# Patient Record
Sex: Male | Born: 1950
Health system: Southern US, Community
[De-identification: ages and names within clinical notes are randomized; demographics above are authoritative.]

## PROBLEM LIST (undated history)

## (undated) DIAGNOSIS — I10 Essential (primary) hypertension: Secondary | ICD-10-CM

## (undated) DIAGNOSIS — E785 Hyperlipidemia, unspecified: Secondary | ICD-10-CM

## (undated) DIAGNOSIS — E7800 Pure hypercholesterolemia, unspecified: Secondary | ICD-10-CM

## (undated) DIAGNOSIS — E119 Type 2 diabetes mellitus without complications: Secondary | ICD-10-CM

## (undated) DIAGNOSIS — I5020 Unspecified systolic (congestive) heart failure: Secondary | ICD-10-CM

## (undated) DIAGNOSIS — I213 ST elevation (STEMI) myocardial infarction of unspecified site: Secondary | ICD-10-CM

---

## 1998-04-14 ENCOUNTER — Emergency Department (HOSPITAL_COMMUNITY): Admission: EM | Admit: 1998-04-14 | Discharge: 1998-04-14 | Payer: Self-pay | Admitting: Emergency Medicine

## 1998-04-15 ENCOUNTER — Emergency Department (HOSPITAL_COMMUNITY): Admission: EM | Admit: 1998-04-15 | Discharge: 1998-04-15 | Payer: Self-pay | Admitting: Emergency Medicine

## 2018-12-12 DIAGNOSIS — R69 Illness, unspecified: Secondary | ICD-10-CM | POA: Diagnosis not present

## 2018-12-12 DIAGNOSIS — Z13220 Encounter for screening for lipoid disorders: Secondary | ICD-10-CM | POA: Diagnosis not present

## 2018-12-12 DIAGNOSIS — Z1211 Encounter for screening for malignant neoplasm of colon: Secondary | ICD-10-CM | POA: Diagnosis not present

## 2018-12-12 DIAGNOSIS — N529 Male erectile dysfunction, unspecified: Secondary | ICD-10-CM | POA: Diagnosis not present

## 2018-12-12 DIAGNOSIS — Z23 Encounter for immunization: Secondary | ICD-10-CM | POA: Diagnosis not present

## 2018-12-12 DIAGNOSIS — Z1159 Encounter for screening for other viral diseases: Secondary | ICD-10-CM | POA: Diagnosis not present

## 2018-12-12 DIAGNOSIS — Z125 Encounter for screening for malignant neoplasm of prostate: Secondary | ICD-10-CM | POA: Diagnosis not present

## 2018-12-12 DIAGNOSIS — Z0000 Encounter for general adult medical examination without abnormal findings: Secondary | ICD-10-CM | POA: Diagnosis not present

## 2018-12-18 DIAGNOSIS — Z1159 Encounter for screening for other viral diseases: Secondary | ICD-10-CM | POA: Diagnosis not present

## 2018-12-18 DIAGNOSIS — R69 Illness, unspecified: Secondary | ICD-10-CM | POA: Diagnosis not present

## 2018-12-18 DIAGNOSIS — Z125 Encounter for screening for malignant neoplasm of prostate: Secondary | ICD-10-CM | POA: Diagnosis not present

## 2018-12-18 DIAGNOSIS — Z0000 Encounter for general adult medical examination without abnormal findings: Secondary | ICD-10-CM | POA: Diagnosis not present

## 2018-12-18 DIAGNOSIS — Z13220 Encounter for screening for lipoid disorders: Secondary | ICD-10-CM | POA: Diagnosis not present

## 2019-01-01 DIAGNOSIS — R7301 Impaired fasting glucose: Secondary | ICD-10-CM | POA: Diagnosis not present

## 2019-01-09 DIAGNOSIS — E1165 Type 2 diabetes mellitus with hyperglycemia: Secondary | ICD-10-CM | POA: Diagnosis not present

## 2019-04-04 DIAGNOSIS — H52203 Unspecified astigmatism, bilateral: Secondary | ICD-10-CM | POA: Diagnosis not present

## 2019-04-04 DIAGNOSIS — H524 Presbyopia: Secondary | ICD-10-CM | POA: Diagnosis not present

## 2019-04-04 DIAGNOSIS — H5203 Hypermetropia, bilateral: Secondary | ICD-10-CM | POA: Diagnosis not present

## 2019-04-04 DIAGNOSIS — E1165 Type 2 diabetes mellitus with hyperglycemia: Secondary | ICD-10-CM | POA: Diagnosis not present

## 2019-04-04 DIAGNOSIS — H2513 Age-related nuclear cataract, bilateral: Secondary | ICD-10-CM | POA: Diagnosis not present

## 2019-04-25 DIAGNOSIS — E7800 Pure hypercholesterolemia, unspecified: Secondary | ICD-10-CM | POA: Diagnosis not present

## 2019-04-25 DIAGNOSIS — E1165 Type 2 diabetes mellitus with hyperglycemia: Secondary | ICD-10-CM | POA: Diagnosis not present

## 2019-08-11 DIAGNOSIS — Z20828 Contact with and (suspected) exposure to other viral communicable diseases: Secondary | ICD-10-CM | POA: Diagnosis not present

## 2019-08-21 DIAGNOSIS — Z125 Encounter for screening for malignant neoplasm of prostate: Secondary | ICD-10-CM | POA: Diagnosis not present

## 2019-08-21 DIAGNOSIS — E1165 Type 2 diabetes mellitus with hyperglycemia: Secondary | ICD-10-CM | POA: Diagnosis not present

## 2019-08-21 DIAGNOSIS — Z23 Encounter for immunization: Secondary | ICD-10-CM | POA: Diagnosis not present

## 2019-10-22 DIAGNOSIS — M19071 Primary osteoarthritis, right ankle and foot: Secondary | ICD-10-CM | POA: Diagnosis not present

## 2019-10-22 DIAGNOSIS — G5792 Unspecified mononeuropathy of left lower limb: Secondary | ICD-10-CM | POA: Diagnosis not present

## 2019-10-22 DIAGNOSIS — M19072 Primary osteoarthritis, left ankle and foot: Secondary | ICD-10-CM | POA: Diagnosis not present

## 2019-10-22 DIAGNOSIS — B070 Plantar wart: Secondary | ICD-10-CM | POA: Diagnosis not present

## 2019-11-18 DIAGNOSIS — B070 Plantar wart: Secondary | ICD-10-CM | POA: Diagnosis not present

## 2019-12-26 DIAGNOSIS — E1165 Type 2 diabetes mellitus with hyperglycemia: Secondary | ICD-10-CM | POA: Diagnosis not present

## 2019-12-26 DIAGNOSIS — Z0000 Encounter for general adult medical examination without abnormal findings: Secondary | ICD-10-CM | POA: Diagnosis not present

## 2019-12-26 DIAGNOSIS — Z79899 Other long term (current) drug therapy: Secondary | ICD-10-CM | POA: Diagnosis not present

## 2019-12-26 DIAGNOSIS — M25542 Pain in joints of left hand: Secondary | ICD-10-CM | POA: Diagnosis not present

## 2019-12-26 DIAGNOSIS — Z125 Encounter for screening for malignant neoplasm of prostate: Secondary | ICD-10-CM | POA: Diagnosis not present

## 2019-12-26 DIAGNOSIS — E7800 Pure hypercholesterolemia, unspecified: Secondary | ICD-10-CM | POA: Diagnosis not present

## 2019-12-26 DIAGNOSIS — Z5181 Encounter for therapeutic drug level monitoring: Secondary | ICD-10-CM | POA: Diagnosis not present

## 2019-12-26 DIAGNOSIS — M25541 Pain in joints of right hand: Secondary | ICD-10-CM | POA: Diagnosis not present

## 2019-12-26 DIAGNOSIS — N529 Male erectile dysfunction, unspecified: Secondary | ICD-10-CM | POA: Diagnosis not present

## 2019-12-29 DIAGNOSIS — B070 Plantar wart: Secondary | ICD-10-CM | POA: Diagnosis not present

## 2020-01-05 DIAGNOSIS — Z1212 Encounter for screening for malignant neoplasm of rectum: Secondary | ICD-10-CM | POA: Diagnosis not present

## 2020-01-05 DIAGNOSIS — Z1211 Encounter for screening for malignant neoplasm of colon: Secondary | ICD-10-CM | POA: Diagnosis not present

## 2020-01-07 LAB — COLOGUARD: COLOGUARD: NEGATIVE

## 2020-01-07 LAB — EXTERNAL GENERIC LAB PROCEDURE: COLOGUARD: NEGATIVE

## 2020-03-24 DIAGNOSIS — E7800 Pure hypercholesterolemia, unspecified: Secondary | ICD-10-CM | POA: Diagnosis not present

## 2020-03-24 DIAGNOSIS — E119 Type 2 diabetes mellitus without complications: Secondary | ICD-10-CM | POA: Diagnosis not present

## 2020-03-24 DIAGNOSIS — N529 Male erectile dysfunction, unspecified: Secondary | ICD-10-CM | POA: Diagnosis not present

## 2020-03-24 DIAGNOSIS — E538 Deficiency of other specified B group vitamins: Secondary | ICD-10-CM | POA: Diagnosis not present

## 2020-03-24 DIAGNOSIS — E1165 Type 2 diabetes mellitus with hyperglycemia: Secondary | ICD-10-CM | POA: Diagnosis not present

## 2020-04-05 DIAGNOSIS — H5203 Hypermetropia, bilateral: Secondary | ICD-10-CM | POA: Diagnosis not present

## 2020-04-05 DIAGNOSIS — E119 Type 2 diabetes mellitus without complications: Secondary | ICD-10-CM | POA: Diagnosis not present

## 2020-04-05 DIAGNOSIS — H2513 Age-related nuclear cataract, bilateral: Secondary | ICD-10-CM | POA: Diagnosis not present

## 2020-04-05 DIAGNOSIS — H52202 Unspecified astigmatism, left eye: Secondary | ICD-10-CM | POA: Diagnosis not present

## 2020-04-05 DIAGNOSIS — Z7984 Long term (current) use of oral hypoglycemic drugs: Secondary | ICD-10-CM | POA: Diagnosis not present

## 2020-04-05 DIAGNOSIS — H524 Presbyopia: Secondary | ICD-10-CM | POA: Diagnosis not present

## 2020-07-10 ENCOUNTER — Other Ambulatory Visit: Payer: Self-pay

## 2020-07-10 ENCOUNTER — Inpatient Hospital Stay (HOSPITAL_COMMUNITY)
Admission: EM | Admit: 2020-07-10 | Discharge: 2020-07-12 | DRG: 246 | Disposition: A | Discharge status: Home / Self Care | Payer: Medicare HMO | Attending: Cardiovascular Disease | Admitting: Cardiovascular Disease

## 2020-07-10 ENCOUNTER — Encounter (HOSPITAL_COMMUNITY)
Admission: EM | Disposition: A | Discharge status: Home / Self Care | Payer: Self-pay | Source: Home / Self Care | Attending: Cardiovascular Disease

## 2020-07-10 ENCOUNTER — Encounter (HOSPITAL_COMMUNITY): Payer: Self-pay

## 2020-07-10 DIAGNOSIS — I442 Atrioventricular block, complete: Secondary | ICD-10-CM | POA: Diagnosis present

## 2020-07-10 DIAGNOSIS — R9431 Abnormal electrocardiogram [ECG] [EKG]: Secondary | ICD-10-CM | POA: Diagnosis not present

## 2020-07-10 DIAGNOSIS — I10 Essential (primary) hypertension: Secondary | ICD-10-CM | POA: Diagnosis not present

## 2020-07-10 DIAGNOSIS — Z8249 Family history of ischemic heart disease and other diseases of the circulatory system: Secondary | ICD-10-CM | POA: Diagnosis not present

## 2020-07-10 DIAGNOSIS — I252 Old myocardial infarction: Secondary | ICD-10-CM

## 2020-07-10 DIAGNOSIS — E785 Hyperlipidemia, unspecified: Secondary | ICD-10-CM

## 2020-07-10 DIAGNOSIS — I472 Ventricular tachycardia: Secondary | ICD-10-CM | POA: Diagnosis not present

## 2020-07-10 DIAGNOSIS — E7800 Pure hypercholesterolemia, unspecified: Secondary | ICD-10-CM | POA: Diagnosis present

## 2020-07-10 DIAGNOSIS — I420 Dilated cardiomyopathy: Secondary | ICD-10-CM | POA: Diagnosis not present

## 2020-07-10 DIAGNOSIS — R0789 Other chest pain: Secondary | ICD-10-CM | POA: Diagnosis not present

## 2020-07-10 DIAGNOSIS — Z7722 Contact with and (suspected) exposure to environmental tobacco smoke (acute) (chronic): Secondary | ICD-10-CM | POA: Diagnosis present

## 2020-07-10 DIAGNOSIS — Z20822 Contact with and (suspected) exposure to covid-19: Secondary | ICD-10-CM | POA: Diagnosis not present

## 2020-07-10 DIAGNOSIS — I2510 Atherosclerotic heart disease of native coronary artery without angina pectoris: Secondary | ICD-10-CM | POA: Diagnosis present

## 2020-07-10 DIAGNOSIS — I110 Hypertensive heart disease with heart failure: Secondary | ICD-10-CM | POA: Diagnosis present

## 2020-07-10 DIAGNOSIS — E119 Type 2 diabetes mellitus without complications: Secondary | ICD-10-CM | POA: Diagnosis not present

## 2020-07-10 DIAGNOSIS — I2111 ST elevation (STEMI) myocardial infarction involving right coronary artery: Secondary | ICD-10-CM

## 2020-07-10 DIAGNOSIS — Z23 Encounter for immunization: Secondary | ICD-10-CM | POA: Diagnosis not present

## 2020-07-10 DIAGNOSIS — I959 Hypotension, unspecified: Secondary | ICD-10-CM | POA: Diagnosis present

## 2020-07-10 DIAGNOSIS — R001 Bradycardia, unspecified: Secondary | ICD-10-CM | POA: Diagnosis present

## 2020-07-10 DIAGNOSIS — R079 Chest pain, unspecified: Secondary | ICD-10-CM | POA: Diagnosis not present

## 2020-07-10 DIAGNOSIS — I5021 Acute systolic (congestive) heart failure: Secondary | ICD-10-CM | POA: Diagnosis not present

## 2020-07-10 DIAGNOSIS — Z7984 Long term (current) use of oral hypoglycemic drugs: Secondary | ICD-10-CM | POA: Diagnosis not present

## 2020-07-10 DIAGNOSIS — I2119 ST elevation (STEMI) myocardial infarction involving other coronary artery of inferior wall: Secondary | ICD-10-CM | POA: Diagnosis not present

## 2020-07-10 DIAGNOSIS — Z79899 Other long term (current) drug therapy: Secondary | ICD-10-CM | POA: Diagnosis not present

## 2020-07-10 DIAGNOSIS — I499 Cardiac arrhythmia, unspecified: Secondary | ICD-10-CM | POA: Diagnosis not present

## 2020-07-10 DIAGNOSIS — I213 ST elevation (STEMI) myocardial infarction of unspecified site: Secondary | ICD-10-CM

## 2020-07-10 DIAGNOSIS — E782 Mixed hyperlipidemia: Secondary | ICD-10-CM | POA: Diagnosis not present

## 2020-07-10 DIAGNOSIS — Z955 Presence of coronary angioplasty implant and graft: Secondary | ICD-10-CM

## 2020-07-10 HISTORY — DX: Pure hypercholesterolemia, unspecified: E78.00

## 2020-07-10 HISTORY — PX: CORONARY STENT INTERVENTION: CATH118234

## 2020-07-10 HISTORY — DX: Type 2 diabetes mellitus without complications: E11.9

## 2020-07-10 HISTORY — PX: CORONARY THROMBECTOMY: CATH118304

## 2020-07-10 HISTORY — PX: LEFT HEART CATH AND CORONARY ANGIOGRAPHY: CATH118249

## 2020-07-10 HISTORY — DX: ST elevation (STEMI) myocardial infarction of unspecified site: I21.3

## 2020-07-10 HISTORY — DX: Hyperlipidemia, unspecified: E78.5

## 2020-07-10 HISTORY — DX: Essential (primary) hypertension: I10

## 2020-07-10 HISTORY — DX: Unspecified systolic (congestive) heart failure: I50.20

## 2020-07-10 HISTORY — PX: CORONARY/GRAFT ACUTE MI REVASCULARIZATION: CATH118305

## 2020-07-10 LAB — CBC
HCT: 40.7 % (ref 39.0–52.0)
Hemoglobin: 13.4 g/dL (ref 13.0–17.0)
MCH: 30.5 pg (ref 26.0–34.0)
MCHC: 32.9 g/dL (ref 30.0–36.0)
MCV: 92.7 fL (ref 80.0–100.0)
Platelets: 212 10*3/uL (ref 150–400)
RBC: 4.39 MIL/uL (ref 4.22–5.81)
RDW: 12.5 % (ref 11.5–15.5)
WBC: 12.3 10*3/uL — ABNORMAL HIGH (ref 4.0–10.5)
nRBC: 0 % (ref 0.0–0.2)

## 2020-07-10 LAB — CBC WITH DIFFERENTIAL/PLATELET
Abs Immature Granulocytes: 0.07 10*3/uL (ref 0.00–0.07)
Basophils Absolute: 0 10*3/uL (ref 0.0–0.1)
Basophils Relative: 0 %
Eosinophils Absolute: 0 10*3/uL (ref 0.0–0.5)
Eosinophils Relative: 0 %
HCT: 45.6 % (ref 39.0–52.0)
Hemoglobin: 14.6 g/dL (ref 13.0–17.0)
Immature Granulocytes: 1 %
Lymphocytes Relative: 14 %
Lymphs Abs: 1.4 10*3/uL (ref 0.7–4.0)
MCH: 30.4 pg (ref 26.0–34.0)
MCHC: 32 g/dL (ref 30.0–36.0)
MCV: 95 fL (ref 80.0–100.0)
Monocytes Absolute: 0.7 10*3/uL (ref 0.1–1.0)
Monocytes Relative: 7 %
Neutro Abs: 7.4 10*3/uL (ref 1.7–7.7)
Neutrophils Relative %: 78 %
Platelets: 213 10*3/uL (ref 150–400)
RBC: 4.8 MIL/uL (ref 4.22–5.81)
RDW: 12.3 % (ref 11.5–15.5)
WBC: 9.6 10*3/uL (ref 4.0–10.5)
nRBC: 0 % (ref 0.0–0.2)

## 2020-07-10 LAB — LIPID PANEL
Cholesterol: 158 mg/dL (ref 0–200)
HDL: 67 mg/dL (ref >40)
LDL Cholesterol: 86 mg/dL (ref 0–99)
Total CHOL/HDL Ratio: 2.4 RATIO
Triglycerides: 25 mg/dL (ref <150)
VLDL: 5 mg/dL (ref 0–40)

## 2020-07-10 LAB — COMPREHENSIVE METABOLIC PANEL
ALT: 21 U/L (ref 0–44)
AST: 26 U/L (ref 15–41)
Albumin: 4.1 g/dL (ref 3.5–5.0)
Alkaline Phosphatase: 68 U/L (ref 38–126)
Anion gap: 13 (ref 5–15)
BUN: 12 mg/dL (ref 8–23)
CO2: 22 mmol/L (ref 22–32)
Calcium: 9.1 mg/dL (ref 8.9–10.3)
Chloride: 102 mmol/L (ref 98–111)
Creatinine, Ser: 1.05 mg/dL (ref 0.61–1.24)
GFR calc Af Amer: >60 mL/min (ref >60)
GFR calc non Af Amer: >60 mL/min (ref >60)
Glucose, Bld: 199 mg/dL — ABNORMAL HIGH (ref 70–99)
Potassium: 4 mmol/L (ref 3.5–5.1)
Sodium: 137 mmol/L (ref 135–145)
Total Bilirubin: 0.9 mg/dL (ref 0.3–1.2)
Total Protein: 7.5 g/dL (ref 6.5–8.1)

## 2020-07-10 LAB — HEMOGLOBIN A1C
Hgb A1c MFr Bld: 6.6 % — ABNORMAL HIGH (ref 4.8–5.6)
Mean Plasma Glucose: 142.72 mg/dL

## 2020-07-10 LAB — CREATININE, SERUM
Creatinine, Ser: 0.9 mg/dL (ref 0.61–1.24)
GFR calc Af Amer: >60 mL/min (ref >60)
GFR calc non Af Amer: >60 mL/min (ref >60)

## 2020-07-10 LAB — SARS CORONAVIRUS 2 BY RT PCR (HOSPITAL ORDER, PERFORMED IN ~~LOC~~ HOSPITAL LAB): SARS Coronavirus 2: NEGATIVE

## 2020-07-10 LAB — GLUCOSE, CAPILLARY
Glucose-Capillary: 127 mg/dL — ABNORMAL HIGH (ref 70–99)
Glucose-Capillary: 143 mg/dL — ABNORMAL HIGH (ref 70–99)

## 2020-07-10 LAB — TROPONIN I (HIGH SENSITIVITY)
Troponin I (High Sensitivity): 14 ng/L (ref <18)
Troponin I (High Sensitivity): >27000 ng/L (ref <18)

## 2020-07-10 LAB — PLATELET COUNT: Platelets: 200 10*3/uL (ref 150–400)

## 2020-07-10 LAB — APTT: aPTT: 22 seconds — ABNORMAL LOW (ref 24–36)

## 2020-07-10 LAB — PROTIME-INR
INR: 1.1 (ref 0.8–1.2)
Prothrombin Time: 13.5 seconds (ref 11.4–15.2)

## 2020-07-10 LAB — HIV ANTIBODY (ROUTINE TESTING W REFLEX): HIV Screen 4th Generation wRfx: NONREACTIVE

## 2020-07-10 LAB — MRSA PCR SCREENING: MRSA by PCR: NEGATIVE

## 2020-07-10 SURGERY — CORONARY/GRAFT ACUTE MI REVASCULARIZATION
Anesthesia: LOCAL

## 2020-07-10 MED ORDER — TIROFIBAN HCL IN NACL 5-0.9 MG/100ML-% IV SOLN
INTRAVENOUS | Status: AC
  Filled 2020-07-10: qty 100

## 2020-07-10 MED ORDER — ROSUVASTATIN CALCIUM 20 MG PO TABS
40.0000 mg | ORAL_TABLET | Freq: Every day | ORAL | Status: DC
  Filled 2020-07-10: qty 2

## 2020-07-10 MED ORDER — ACETAMINOPHEN 325 MG PO TABS: 650.0000 mg | ORAL_TABLET | ORAL | Status: DC | PRN

## 2020-07-10 MED ORDER — IOHEXOL 350 MG/ML SOLN
INTRAVENOUS | Status: DC | PRN
  Administered 2020-07-10: 130 mL via INTRA_ARTERIAL

## 2020-07-10 MED ORDER — SODIUM CHLORIDE 0.9% FLUSH
3.0000 mL | Freq: Two times a day (BID) | INTRAVENOUS | Status: DC
  Administered 2020-07-11 – 2020-07-12 (×3): 3 mL via INTRAVENOUS

## 2020-07-10 MED ORDER — LABETALOL HCL 5 MG/ML IV SOLN: 10.0000 mg | INTRAVENOUS | Status: AC | PRN

## 2020-07-10 MED ORDER — LIDOCAINE HCL (PF) 1 % IJ SOLN
INTRAMUSCULAR | Status: AC
  Filled 2020-07-10: qty 30

## 2020-07-10 MED ORDER — CHLORHEXIDINE GLUCONATE CLOTH 2 % EX PADS
6.0000 | MEDICATED_PAD | Freq: Every day | CUTANEOUS | Status: DC
  Administered 2020-07-10 – 2020-07-12 (×3): 6 via TOPICAL

## 2020-07-10 MED ORDER — TIROFIBAN HCL IN NACL 5-0.9 MG/100ML-% IV SOLN
INTRAVENOUS | Status: AC | PRN
  Administered 2020-07-10: 0.15 ug/kg/min via INTRAVENOUS

## 2020-07-10 MED ORDER — SODIUM CHLORIDE 0.9 % IV SOLN
INTRAVENOUS | Status: AC | PRN
  Administered 2020-07-10: 10 mL/h via INTRAVENOUS

## 2020-07-10 MED ORDER — MORPHINE SULFATE (PF) 2 MG/ML IV SOLN: 2.0000 mg | INTRAVENOUS | Status: DC | PRN

## 2020-07-10 MED ORDER — METOPROLOL TARTRATE 25 MG PO TABS: 25.0000 mg | ORAL_TABLET | Freq: Two times a day (BID) | ORAL | Status: DC

## 2020-07-10 MED ORDER — TICAGRELOR 90 MG PO TABS
90.0000 mg | ORAL_TABLET | Freq: Two times a day (BID) | ORAL | Status: DC
  Administered 2020-07-10 – 2020-07-12 (×4): 90 mg via ORAL
  Filled 2020-07-10 (×4): qty 1

## 2020-07-10 MED ORDER — HEPARIN (PORCINE) IN NACL 1000-0.9 UT/500ML-% IV SOLN
INTRAVENOUS | Status: AC
  Filled 2020-07-10: qty 1000

## 2020-07-10 MED ORDER — HEPARIN SODIUM (PORCINE) 1000 UNIT/ML IJ SOLN
INTRAMUSCULAR | Status: AC
  Filled 2020-07-10: qty 1

## 2020-07-10 MED ORDER — SODIUM CHLORIDE 0.9 % IV SOLN: 250.0000 mL | INTRAVENOUS | Status: DC | PRN

## 2020-07-10 MED ORDER — ROSUVASTATIN CALCIUM 20 MG PO TABS
40.0000 mg | ORAL_TABLET | Freq: Every day | ORAL | Status: DC
  Administered 2020-07-10 – 2020-07-12 (×3): 40 mg via ORAL
  Filled 2020-07-10 (×2): qty 2

## 2020-07-10 MED ORDER — HEPARIN SODIUM (PORCINE) 5000 UNIT/ML IJ SOLN
5000.0000 [IU] | Freq: Three times a day (TID) | INTRAMUSCULAR | Status: DC
  Administered 2020-07-10 – 2020-07-12 (×6): 5000 [IU] via SUBCUTANEOUS
  Filled 2020-07-10 (×6): qty 1

## 2020-07-10 MED ORDER — ATORVASTATIN CALCIUM 80 MG PO TABS: 80.0000 mg | ORAL_TABLET | Freq: Every day | ORAL | Status: DC

## 2020-07-10 MED ORDER — PNEUMOCOCCAL VAC POLYVALENT 25 MCG/0.5ML IJ INJ
0.5000 mL | INJECTION | INTRAMUSCULAR | Status: AC
  Administered 2020-07-12: 0.5 mL via INTRAMUSCULAR
  Filled 2020-07-10: qty 0.5

## 2020-07-10 MED ORDER — IOHEXOL 350 MG/ML SOLN
INTRAVENOUS | Status: AC
  Filled 2020-07-10: qty 1

## 2020-07-10 MED ORDER — METOPROLOL TARTRATE 12.5 MG HALF TABLET: 12.5000 mg | ORAL_TABLET | Freq: Two times a day (BID) | ORAL | Status: DC

## 2020-07-10 MED ORDER — SODIUM CHLORIDE 0.9% FLUSH: 3.0000 mL | INTRAVENOUS | Status: DC | PRN

## 2020-07-10 MED ORDER — TICAGRELOR 90 MG PO TABS
ORAL_TABLET | ORAL | Status: DC | PRN
  Administered 2020-07-10: 180 mg via ORAL

## 2020-07-10 MED ORDER — ASPIRIN EC 81 MG PO TBEC: 81.0000 mg | DELAYED_RELEASE_TABLET | Freq: Every day | ORAL | Status: DC

## 2020-07-10 MED ORDER — ONDANSETRON HCL 4 MG/2ML IJ SOLN: 4.0000 mg | Freq: Four times a day (QID) | INTRAMUSCULAR | Status: DC | PRN

## 2020-07-10 MED ORDER — HYDRALAZINE HCL 20 MG/ML IJ SOLN: 10.0000 mg | INTRAMUSCULAR | Status: AC | PRN

## 2020-07-10 MED ORDER — TICAGRELOR 90 MG PO TABS
ORAL_TABLET | ORAL | Status: AC
  Filled 2020-07-10: qty 2

## 2020-07-10 MED ORDER — LIDOCAINE HCL (PF) 1 % IJ SOLN
INTRAMUSCULAR | Status: DC | PRN
  Administered 2020-07-10: 2 mL

## 2020-07-10 MED ORDER — TIROFIBAN (AGGRASTAT) BOLUS VIA INFUSION
INTRAVENOUS | Status: DC | PRN
  Administered 2020-07-10: 2267.5 ug via INTRAVENOUS

## 2020-07-10 MED ORDER — HEPARIN SODIUM (PORCINE) 1000 UNIT/ML IJ SOLN
INTRAMUSCULAR | Status: DC | PRN
  Administered 2020-07-10: 2000 [IU] via INTRAVENOUS
  Administered 2020-07-10: 4000 [IU] via INTRAVENOUS
  Administered 2020-07-10: 6000 [IU] via INTRAVENOUS

## 2020-07-10 MED ORDER — METOPROLOL TARTRATE 12.5 MG HALF TABLET
12.5000 mg | ORAL_TABLET | Freq: Two times a day (BID) | ORAL | Status: DC
  Administered 2020-07-10 – 2020-07-11 (×3): 12.5 mg via ORAL
  Filled 2020-07-10 (×3): qty 1

## 2020-07-10 MED ORDER — TIROFIBAN HCL IN NACL 5-0.9 MG/100ML-% IV SOLN
0.1500 ug/kg/min | INTRAVENOUS | Status: AC
  Administered 2020-07-10: 0.15 ug/kg/min via INTRAVENOUS
  Filled 2020-07-10 (×2): qty 100

## 2020-07-10 MED ORDER — INSULIN ASPART 100 UNIT/ML ~~LOC~~ SOLN
0.0000 [IU] | Freq: Three times a day (TID) | SUBCUTANEOUS | Status: DC
  Administered 2020-07-10 – 2020-07-12 (×4): 2 [IU] via SUBCUTANEOUS
  Administered 2020-07-12: 3 [IU] via SUBCUTANEOUS

## 2020-07-10 MED ORDER — HEPARIN SODIUM (PORCINE) 5000 UNIT/ML IJ SOLN
4000.0000 [IU] | Freq: Once | INTRAMUSCULAR | Status: AC
  Administered 2020-07-10: 4000 [IU] via INTRAVENOUS

## 2020-07-10 MED ORDER — HEPARIN (PORCINE) IN NACL 1000-0.9 UT/500ML-% IV SOLN
INTRAVENOUS | Status: DC | PRN
  Administered 2020-07-10 (×2): 500 mL

## 2020-07-10 MED ORDER — VERAPAMIL HCL 2.5 MG/ML IV SOLN
INTRAVENOUS | Status: AC
  Filled 2020-07-10: qty 2

## 2020-07-10 MED ORDER — ATROPINE SULFATE 1 MG/10ML IJ SOSY
PREFILLED_SYRINGE | INTRAMUSCULAR | Status: AC
  Filled 2020-07-10: qty 10

## 2020-07-10 MED ORDER — ATROPINE SULFATE 1 MG/10ML IJ SOSY
PREFILLED_SYRINGE | INTRAMUSCULAR | Status: DC | PRN
  Administered 2020-07-10: 1 mg via INTRAVENOUS

## 2020-07-10 MED ORDER — SODIUM CHLORIDE 0.9 % IV SOLN: INTRAVENOUS | Status: AC

## 2020-07-10 MED ORDER — VERAPAMIL HCL 2.5 MG/ML IV SOLN
INTRA_ARTERIAL | Status: DC | PRN
  Administered 2020-07-10: 10 mL via INTRA_ARTERIAL
  Administered 2020-07-10: 5 mL via INTRA_ARTERIAL

## 2020-07-10 MED ORDER — NITROGLYCERIN 1 MG/10 ML FOR IR/CATH LAB
INTRA_ARTERIAL | Status: AC
  Filled 2020-07-10: qty 10

## 2020-07-10 MED ORDER — SODIUM CHLORIDE 0.9 % IV SOLN: INTRAVENOUS | Status: DC

## 2020-07-10 MED ORDER — ASPIRIN 81 MG PO CHEW
81.0000 mg | CHEWABLE_TABLET | Freq: Every day | ORAL | Status: DC
  Administered 2020-07-11 – 2020-07-12 (×2): 81 mg via ORAL
  Filled 2020-07-10 (×2): qty 1

## 2020-07-10 MED ORDER — NITROGLYCERIN 0.4 MG SL SUBL: 0.4000 mg | SUBLINGUAL_TABLET | SUBLINGUAL | Status: DC | PRN

## 2020-07-10 SURGICAL SUPPLY — 27 items
BALLN SAPPHIRE 2.0X12 (BALLOONS) ×2
BALLN SAPPHIRE 2.5X20 (BALLOONS) ×2
BALLN ~~LOC~~ EMERGE MR 3.25X20 (BALLOONS) ×2
BALLOON SAPPHIRE 2.0X12 (BALLOONS) ×1 IMPLANT
BALLOON SAPPHIRE 2.5X20 (BALLOONS) ×1 IMPLANT
BALLOON ~~LOC~~ EMERGE MR 3.25X20 (BALLOONS) ×1 IMPLANT
CATH EXTRAC PRONTO 5.5F 138CM (CATHETERS) ×2 IMPLANT
CATH INFINITI 5FR ANG PIGTAIL (CATHETERS) ×2 IMPLANT
CATH LAUNCHER 6FR JR4 (CATHETERS) ×2 IMPLANT
CATH OPTITORQUE TIG 4.0 5F (CATHETERS) ×2 IMPLANT
CATH VISTA GUIDE 6FR 3DRC (CATHETERS) IMPLANT
CATH VISTA GUIDE 6FR XB3.5 (CATHETERS) IMPLANT
DEVICE RAD COMP TR BAND LRG (VASCULAR PRODUCTS) ×2 IMPLANT
ELECT DEFIB PAD ADLT CADENCE (PAD) ×2 IMPLANT
GLIDESHEATH SLEND A-KIT 6F 22G (SHEATH) ×2 IMPLANT
GUIDEWIRE INQWIRE 1.5J.035X260 (WIRE) ×1 IMPLANT
INQWIRE 1.5J .035X260CM (WIRE) ×2
KIT ENCORE 26 ADVANTAGE (KITS) ×2 IMPLANT
KIT HEART LEFT (KITS) ×2 IMPLANT
PACK CARDIAC CATHETERIZATION (CUSTOM PROCEDURE TRAY) ×2 IMPLANT
STENT SYNERGY XD 3.0X32 (Permanent Stent) ×2 IMPLANT
SYR MEDRAD MARK 7 150ML (SYRINGE) ×2 IMPLANT
TRANSDUCER W/STOPCOCK (MISCELLANEOUS) ×2 IMPLANT
TUBING CIL FLEX 10 FLL-RA (TUBING) ×2 IMPLANT
WIRE ASAHI PROWATER 180CM (WIRE) ×2 IMPLANT
WIRE HI TORQ VERSACORE-J 145CM (WIRE) ×2 IMPLANT
WIRE MICROINTRODUCER 60CM (WIRE) IMPLANT

## 2020-07-10 NOTE — Plan of Care (Signed)
  Problem: Education: Goal: Understanding of CV disease, CV risk reduction, and recovery process will improve Outcome: Progressing   Problem: Activity: Goal: Ability to return to baseline activity level will improve Outcome: Progressing   Problem: Cardiovascular: Goal: Ability to achieve and maintain adequate cardiovascular perfusion will improve Outcome: Progressing   Problem: Clinical Measurements: Goal: Ability to maintain clinical measurements within normal limits will improve Outcome: Progressing Goal: Will remain free from infection Outcome: Progressing Goal: Cardiovascular complication will be avoided Outcome: Progressing   Problem: Nutrition: Goal: Adequate nutrition will be maintained Outcome: Progressing   Problem: Safety: Goal: Ability to remain free from injury will improve Outcome: Progressing

## 2020-07-10 NOTE — ED Provider Notes (Signed)
[facility] EMERGENCY DE[facility]MENT Provider Note   CSN: [id number] Arrival date & time: 07/10/20  1054     History Chief Complaint  Patient presents with  . Code STEMI    Cory Larson is a 69 y.o. male with history of diabetes, high cholesterol, hypertension, presented ED with chest pain.  Patient reports he woke up in his usual state of health this morning.  Around 830 he had a sudden onset of headache, nausea, left-sided chest pressure.  Says it was a dull ache in his left side that he has never felt before.  He called EMS and they administered full dose aspirin as well as 1 dose SL nitro, which he reports relieved his chest pain.  He now has 0 out of 10 chest pain.  Patient denies any history of MI or cardiac stents.  Denies any significant family history of MI.  He did receive both of his Covid vaccines.  He reports he is normally very active and exercises regularly, and has never had chest pain or angina before.  Smokes marijuana occasionally, no nicotine smoking, no other illicit drug use.  NKDA  HPI     Past Medical History:  Diagnosis Date  . Diabetes mellitus without complication ([place])   . High cholesterol   . Hypertension     Patient Active Problem List   Diagnosis Date Noted  . Acute ST elevation myocardial infarction (STEMI) of inferior wall ([place]) 07/10/2020  . Acute myocardial infarction ([place]) 07/10/2020    History reviewed. No pertinent surgical history.     Family History  Problem Relation Age of Onset  . Heart disease Paternal Grandfather     Social History   Tobacco Use  . Smoking status: Never Smoker  Substance Use Topics  . Alcohol use: Not on file  . Drug use: Not on file    Home Medications Prior to Admission medications   Medication Sig Start Date End Date Taking? Authorizing Provider  glipiZIDE (GLUCOTROL XL) 5 MG 24 hr tablet Take 5 mg by mouth daily. 03/28/20  Yes [provider]  metFORMIN  (GLUCOPHAGE) 500 MG tablet Take 500 mg by mouth daily. 03/30/20  Yes [provider]  rosuvastatin (CRESTOR) 20 MG tablet Take 20 mg by mouth daily. 03/28/20  Yes [provider]  sildenafil (VIAGRA) 50 MG tablet Take 50-100 mg by mouth as needed. Prior to sex 03/28/20  Yes [provider]    Allergies    Patient has no known allergies.  Review of Systems   Review of Systems  Constitutional: Positive for diaphoresis. Negative for chills and fever.  HENT: Negative for ear pain and sore throat.   Eyes: Negative for photophobia and visual disturbance.  Respiratory: Positive for shortness of breath. Negative for cough.   Cardiovascular: Positive for chest pain. Negative for palpitations.  Gastrointestinal: Positive for nausea. Negative for abdominal pain.  Genitourinary: Negative for dysuria and hematuria.  Musculoskeletal: Negative for arthralgias and back pain.  Skin: Negative for color change and rash.  Neurological: Positive for light-headedness. Negative for syncope.  Psychiatric/Behavioral: Negative for agitation and confusion.  All other systems reviewed and are negative.   Physical Exam Updated Vital Signs BP (!) 126/99   Pulse 68   Temp (!) 97.3 F (36.3 C)   Resp 15   Ht 6' (1.829 m)   Wt 90.7 kg   SpO2 100%   BMI 27.12 kg/m   Physical Exam Vitals and nursing note reviewed.  Constitutional:  Appearance: He is well-developed.  HENT:     Head: Normocephalic and atraumatic.  Eyes:     Conjunctiva/sclera: Conjunctivae normal.  Cardiovascular:     Rate and Rhythm: Normal rate and regular rhythm.     Heart sounds: No murmur heard.   Pulmonary:     Effort: Pulmonary effort is normal. No respiratory distress.     Breath sounds: Normal breath sounds.  Abdominal:     Palpations: Abdomen is soft.     Tenderness: There is no abdominal tenderness.  Musculoskeletal:     Cervical back: Neck supple.  Skin:    General: Skin is warm and dry.   Neurological:     General: No focal deficit present.     Mental Status: He is alert and oriented to person, place, and time.  Psychiatric:        Mood and Affect: Mood normal.        Behavior: Behavior normal.     ED Results / Procedures / Treatments   Labs (all labs ordered are listed, but only abnormal results are displayed) Labs Reviewed  APTT - Abnormal; Notable for the following components:      Result Value   aPTT 22 (*)    All other components within normal limits  COMPREHENSIVE METABOLIC PANEL - Abnormal; Notable for the following components:   Glucose, Bld 199 (*)    All other components within normal limits  HEMOGLOBIN A1C - Abnormal; Notable for the following components:   Hgb A1c MFr Bld 6.6 (*)    All other components within normal limits  CBC - Abnormal; Notable for the following components:   WBC 12.3 (*)    All other components within normal limits  GLUCOSE, CAPILLARY - Abnormal; Notable for the following components:   Glucose-Capillary 127 (*)    All other components within normal limits  SARS CORONAVIRUS 2 BY RT PCR (HOSPITAL ORDER, [facility])  MRSA PCR SCREENING  CBC WITH DIFFERENTIAL/PLATELET  PROTIME-INR  LIPID PANEL  HIV ANTIBODY (ROUTINE TESTING W REFLEX)  CREATININE, SERUM  PLATELET COUNT  BASIC METABOLIC PANEL  CBC  BASIC METABOLIC PANEL  LIPID PANEL  CBC  HEMOGLOBIN A1C  TROPONIN I (HIGH SENSITIVITY)  TROPONIN I (HIGH SENSITIVITY)    EKG None  Radiology CARDIAC CATHETERIZATION  Result Date: 07/10/2020  Prox RCA lesion is 100% stenosed.  A drug-eluting stent was successfully placed using a SYNERGY XD [id number].  Post intervention, there is a 0% residual stenosis.  There is moderate to severe left ventricular systolic dysfunction.  LV end diastolic pressure is mildly elevated.  The left ventricular ejection fraction is 35-45% by visual estimate.  Cory Larson is a 69 y.o. male  [id number] LOCATION:   FACILITY: [facility] PHYSICIAN: Quay Burow, M.D. 04-Nov-1950 DATE OF PROCEDURE:  07/10/2020 DATE OF DISCHARGE: CARDIAC CATHETERIZATION / PCI DES RCA History obtained from chart review.  69 year old fit appearing single Caucasian male father of 2 children who works as a Armed forces training and education officer but developed sudden onset chest pain at 8:30 this morning.  History factors include treated diabetes, hyperlipidemia and untreated hypertension.  He does not smoke.  Never had a heart attack or stroke.  He did dial 911 and was brought by EMS to [facility] emergency room where his EKG showed inferolateral ST segment elevation.  His pain somewhat improved with sublingual nitroglycerin.  He was hypertensive in the ER with a blood pressure of 170/110 although his pain had markedly improved.  He  did have persistent ST segment elevation.  He was brought urgently to the Cath Lab for angiography and intervention. PROCEDURE DESCRIPTION: The patient was brought to the second floor [facility] cath lab in the postabsorptive state. He was not premedicated . His right wrist was prepped and shaved in usual sterile fashion. Xylocaine 1% was used for local anesthesia. A 6 French sheath was inserted into the right radial  artery using standard Seldinger technique. The patient received 6000 units  of heparin intravenously.  A 5 [place] TIG catheter and pigtail catheters were used for selective coronary angiography and left ventriculography respectively.  Isovue dye was used for the entirety of the case.  Retrograde aorta, ventricular and pullback pressures were recorded.  Radial cocktail was administered via the SideArm sheath. The patient received an additional 10,000's of heparin (16,000 units total) with an ACT of over 500.  He received Brilinta 180 mg p.o. and had received aspirin in the emergency room.  Isovue-used for the entirety intervention.  Retroaortic pressures monitored in the case. Using a 6 [place] JR4 guide catheter, a 0.14/180 cm  long [place] guidewire, and a 2 mm x 12 mm balloon the proximal RCA was crossed and predilated.  This did provide antegrade flow it with.  There was a significant amount of intraluminal angiographic thrombus.  I attempted to aspirate this using a "Pronto" mechanical thrombectomy device.  I was able aspirate a significant amount of thrombus.  I then continue to postdilated with a 2.5 x 20 mm long balloon and placed with some difficulty a 3 mm x 32 mm long Synergy drug-eluting stent deployed at 14 atm.  I postdilated with a 3.25 x 20 mm long balloon at 14 atm (3.33 mm) resulting reduction of total occlusion to 0% residual TIMI-3 flow.  The patient did experience significant bradycardia with restoration of antegrade flow which responded nicely to 1 mg of intravenous atropine.  Because of the visible thrombus he was given a bolus of Aggrastat and placed on Aggrastat drip for 6 hours.   Mr. Breeze had a large inferolateral STEMI secondary to occlusion of a large dominant RCA with door to balloon time of 36 minutes.  He has mild segmental proximal LAD disease of no significance, a small circumflex and a large trifurcating ramus branch.  His EF is in the 30 to 35% range with severe inferior and inferoapical wall motion abnormality as well as moderate anterior hypokinesia.  He will need to be treated with guideline directed optimal medical therapy including beta-blocker, ACE inhibitor and high-dose statin therapy.  He will need to remain on dual antiplatelet therapy uninterrupted for 12 months.  A 2D echo is pending.  He will most likely be ready for discharge on Monday or Tuesday.  The sheath was removed and a TR band was placed on the right wrist to achieve patent hemostasis.  The patient left lab in stable condition. Quay Burow. MD, [facility] 07/10/2020 12:56 PM    Procedures .Critical Care Performed by: Wyvonnia Dusky, MD Authorized by: Wyvonnia Dusky, MD   Critical care provider statement:    Critical  care time (minutes):  40   Critical care was necessary to treat or prevent imminent or life-threatening deterioration of the following conditions:  Circulatory failure   Critical care was time spent personally by me on the following activities:  Discussions with consultants, evaluation of patient's response to treatment, examination of patient, ordering and performing treatments and interventions, ordering and review of laboratory studies,  ordering and review of radiographic studies, pulse oximetry, re-evaluation of patient's condition, obtaining history from patient or surrogate and review of old charts Comments:     STEMI, ACS  Heparin  Medications Ordered in ED Medications  0.9 %  sodium chloride infusion ( Intravenous Not Given 07/10/20 1347)  labetalol (NORMODYNE) injection 10 mg (has no administration in time range)  hydrALAZINE (APRESOLINE) injection 10 mg (has no administration in time range)  acetaminophen (TYLENOL) tablet 650 mg (has no administration in time range)  ondansetron (ZOFRAN) injection 4 mg (has no administration in time range)  0.9 %  sodium chloride infusion ( Intravenous Rate/Dose Verify 07/10/20 1502)  sodium chloride flush (NS) 0.9 % injection 3 mL (has no administration in time range)  sodium chloride flush (NS) 0.9 % injection 3 mL (has no administration in time range)  0.9 %  sodium chloride infusion (has no administration in time range)  morphine 2 MG/ML injection 2 mg (has no administration in time range)  aspirin chewable tablet 81 mg (has no administration in time range)  ticagrelor (BRILINTA) tablet 90 mg (has no administration in time range)  tirofiban (AGGRASTAT) infusion 50 mcg/mL 100 mL (0.15 mcg/kg/min  90.7 kg Intravenous New Bag/Given 07/10/20 1502)  Chlorhexidine Gluconate Cloth 2 % PADS 6 each (6 each Topical Given 07/10/20 1331)  rosuvastatin (CRESTOR) tablet 40 mg (has no administration in time range)  rosuvastatin (CRESTOR) tablet 40 mg (40 mg Oral  Given by Other 07/10/20 1512)  nitroGLYCERIN (NITROSTAT) SL tablet 0.4 mg (has no administration in time range)  heparin injection 5,000 Units (5,000 Units Subcutaneous Given 07/10/20 1624)  insulin aspart (novoLOG) injection 0-15 Units (2 Units Subcutaneous Given 07/10/20 1625)  metoprolol tartrate (LOPRESSOR) tablet 12.5 mg (12.5 mg Oral Given by Other 07/10/20 1512)  pneumococcal 23 valent vaccine (PNEUMOVAX-23) injection 0.5 mL (has no administration in time range)  heparin injection 4,000 Units (4,000 Units Intravenous Given 07/10/20 1107)  0.9 %  sodium chloride infusion (10 mL/hr Intravenous New Bag/Given 07/10/20 1150)  0.9 %  sodium chloride infusion ( Intravenous Rate/Dose Change 07/10/20 1207)  tirofiban (AGGRASTAT) infusion 50 mcg/mL 100 mL (0.15 mcg/kg/min  90.7 kg Intravenous New Bag/Given 07/10/20 1219)    ED Course  I have reviewed the triage vital signs and the nursing notes.  Pertinent labs & imaging results that were available during my care of the patient were reviewed by me and considered in my medical decision making (see chart for details).  23 male present emergency department with concern for acute STEMI, inferior per EKG leads.  Onset around 830 today.  Patient is otherwise well-appearing and hemodynamically stable.  He ready received full dose aspirin from EMS.  We will not give any additional nitroglycerin as this is an inferior MI.  Cardiology present at bedside within 10 minutes of the patient's arrival.  Anticipate Cath Lab.  Patient made NPO.  Labs and Covid swab for screening were ordered.  IV heparin ordered.  ECG on arrival per my interpretation shows ST elevations in inferior leads concerning for inferior MI.  Clinical Course as of Jul 10 1702  Sat Jul 10, 2020  1101 Cards team at bedside, patient reporting 0/10 chest pain.  ECG shows inferior STEMI   [MT]    Clinical Course User Index [MT] Catalena Stanhope, Carola Rhine, MD   Final Clinical Impression(s) / ED  Diagnoses Final diagnoses:  ST elevation myocardial infarction (STEMI), unspecified artery [facility])    Rx / DC Orders ED Discharge Orders  Ordered    AMB Referral to Cardiac Rehabilitation - Phase II        07/10/20 1249           Wyvonnia Dusky, MD 07/10/20 [phone]

## 2020-07-10 NOTE — H&P (Addendum)
Cardiology Admission History and Physical:   Patient ID: Cory Larson MRN: [id number]; DOB: Sep 18, 1951   Admission date: 07/10/2020  Primary Care Provider: [name], MD [facility] Cardiologist: [name], MD [facility] Electrophysiologist:  None   Chief Complaint:  Inferior STEMI  Patient Profile:   Cory Larson is a 69 y.o. male with PMH significant for HLD on crestor and DM on glipizide and metformin.   History of Present Illness:   Mr. Micale with no prior cardiac history experienced onset of chest pain that felt like a squeezing pressure at approximately 0830, about 30 min after waking. This was associated with diaphoresis and SOB. EMS was dispatched, Tracing with ST elevation in inferior leads. He was given 324 mg ASA and nitro x 1. There was significant improvement in his chest pain. Upon arrival, he had some mild chest pressure. Repeat EKG with ST elevation III and AVF with ST depression AVL and V6.  He was given 4000 U heparin. CODE STEMI activated.  He has two grown children, son in [place] and daughter in [place]. No grandchildren. Two siblings without major medical problems. Both parents were smokers. No significant family history of heart disease. He is a never smoker. No known allergies. He works as a Geographical information systems officer.   Past Medical History:  Diagnosis Date   Diabetes mellitus without complication (HCC)    High cholesterol    Hypertension     History reviewed. No pertinent surgical history.   Medications Prior to Admission: Prior to Admission medications   Not on File     Allergies:   Not on File  Social History:   Social History   Socioeconomic History   Marital status: Married    Spouse name: Not on file   Number of children: Not on file   Years of education: Not on file   Highest education level: Not on file  Occupational History   Not on file  Tobacco Use   Smoking status: Never Smoker  Substance and Sexual  Activity   Alcohol use: Not on file   Drug use: Not on file   Sexual activity: Not on file  Other Topics Concern   Not on file  Social History Narrative   Not on file   Social Determinants of Health   Financial Resource Strain:    Difficulty of Paying Living Expenses: Not on file  Food Insecurity:    Worried About Running Out of Food in the Last Year: Not on file   Ran Out of Food in the Last Year: Not on file  Transportation Needs:    Lack of Transportation (Medical): Not on file   Lack of Transportation (Non-Medical): Not on file  Physical Activity:    Days of Exercise per Week: Not on file   Minutes of Exercise per Session: Not on file  Stress:    Feeling of Stress : Not on file  Social Connections:    Frequency of Communication with Friends and Family: Not on file   Frequency of Social Gatherings with Friends and Family: Not on file   Attends Religious Services: Not on file   Active Member of Clubs or Organizations: Not on file   Attends Banker Meetings: Not on file   Marital Status: Not on file  Intimate Partner Violence:    Fear of Current or Ex-Partner: Not on file   Emotionally Abused: Not on file   Physically Abused: Not on file   Sexually  Abused: Not on file    Family History:   The patient's family history includes Heart disease in his paternal grandfather.  Both parents were smokers.  ROS:  Please see the history of present illness.  All other ROS reviewed and negative.     Physical Exam/Data:   Vitals:   07/10/20 1058  BP: (!) 162/94  Pulse: 64  Resp: 18  Temp: (!) 97.3 F (36.3 C)  SpO2: 99%  Weight: 90.7 kg  Height: 6' (1.829 m)   No intake or output data in the 24 hours ending 07/10/20 1140 Last 3 Weights 07/10/2020  Weight (lbs) 200 lb  Weight (kg) 90.719 kg     Body mass index is 27.12 kg/m.  General:  Well nourished, well developed, in no acute distress HEENT: normal Neck: no JVD Endocrine:  No thryomegaly Vascular:  No carotid bruits Cardiac:  normal S1, S2; RRR; no murmur  Lungs:  clear to auscultation bilaterally, no wheezing, rhonchi or rales  Abd: soft, nontender, no hepatomegaly  Ext: no edema Musculoskeletal:  No deformities, BUE and BLE strength normal and equal Skin: warm and dry  Neuro:  CNs 2-12 intact, no focal abnormalities noted Psych:  Normal affect    EKG:  The ECG that was done was personally reviewed and demonstrates sinus rhythm HR 64, iLBBB, ST elevation III, AVR, ST depression AVF and V6  Relevant CV Studies:  Angiography pending  Laboratory Data:  High Sensitivity Troponin:  No results for input(s): TROPONINIHS in the last 720 hours.    ChemistryNo results for input(s): NA, K, CL, CO2, GLUCOSE, BUN, CREATININE, CALCIUM, GFRNONAA, GFRAA, ANIONGAP in the last 168 hours.  No results for input(s): PROT, ALBUMIN, AST, ALT, ALKPHOS, BILITOT in the last 168 hours. Hematology Recent Labs  Lab 07/10/20 1114  WBC 9.6  RBC 4.80  HGB 14.6  HCT 45.6  MCV 95.0  MCH 30.4  MCHC 32.0  RDW 12.3  PLT 213   BNPNo results for input(s): BNP, PROBNP in the last 168 hours.  DDimer No results for input(s): DDIMER in the last 168 hours.   Radiology/Studies:  No results found.     TIMI Risk Score for ST  Elevation MI:   The patient's TIMI risk score is 4, which indicates a 7.3% risk of all cause mortality at 30 days.       Assessment and Plan:   Inferior STEMI Patient evaluated in the ER and taken emergently to the cath lab. Angiography in process. Echo ordered.   Hyperlipidemia with LDL < 70 Will collect fasting lipid panel tomorrow. Already on crestor 20 mg. Increase this to 40 mg.   DM Home metformin and glipizide held.  SSI Would benefit from jardiance. Will collect A1c.   Hypertension Was hypertensive in the ER. Taylor medications depending on angiography and EF. Has no history of HTN.     Severity of Illness: The appropriate patient status for this patient  is INPATIENT. Inpatient status is judged to be reasonable and necessary in order to provide the required intensity of service to ensure the patient's safety. The patient's presenting symptoms, physical exam findings, and initial radiographic and laboratory data in the context of their chronic comorbidities is felt to place them at high risk for further clinical deterioration. Furthermore, it is not anticipated that the patient will be medically stable for discharge from the hospital within 2 midnights of admission. The following factors support the patient status of inpatient.   " The patient's presenting symptoms  include angina. " The worrisome physical exam findings include angian. " The initial radiographic and laboratory data are worrisome because of STEMI. " The chronic co-morbidities include HLD, DM.   * I certify that at the point of admission it is my clinical judgment that the patient will require inpatient hospital care spanning beyond 2 midnights from the point of admission due to high intensity of service, high risk for further deterioration and high frequency of surveillance required.*    For questions or updates, please contact [facility] Please consult [email or web address] for contact info under     Signed, Marcelino Duster, PA  07/10/2020 11:40 AM    Agree with note by Micah Flesher PA-C  Mr. Herendeen presented to the emergency room with inferior STEMI after developing chest pain at 830 this morning.  His risk factors include treated diabetes and hyperlipidemia and untreated hypertension.  He does not smoke.  His pain improved with several nitroglycerin.  He was given aspirin and heparin bolus.  His CK EKG was consistent with an inferior STEMI with inferolateral ST segment elevation.  He was hypertensive.  His exam is benign.  He was brought urgently to the Cath Lab for angiography and intervention.  Runell Gess, M.D., FACP, [facility], [facility]  [facility]Care [address] [place], [place]  [address]  [phone] 07/10/2020 1:09 PM

## 2020-07-10 NOTE — ED Triage Notes (Signed)
Patient arrived by [facility] from home after developing chest pain at 0830 this am. Patient has no hx of same. Patient received 324mg  aspiring and 1 sl ntg prior to arrival. Denies CP on arrival. Non-smoker, vaccinated in April. Patient alert and oriented, NAD

## 2020-07-10 NOTE — ED Notes (Signed)
TO CATH LAB NOW 

## 2020-07-10 NOTE — ED Notes (Signed)
324 ASA BY EMS

## 2020-07-11 ENCOUNTER — Inpatient Hospital Stay (HOSPITAL_COMMUNITY): Payer: Medicare HMO

## 2020-07-11 DIAGNOSIS — I5021 Acute systolic (congestive) heart failure: Secondary | ICD-10-CM | POA: Diagnosis not present

## 2020-07-11 DIAGNOSIS — I472 Ventricular tachycardia: Secondary | ICD-10-CM | POA: Diagnosis not present

## 2020-07-11 DIAGNOSIS — R001 Bradycardia, unspecified: Secondary | ICD-10-CM | POA: Diagnosis not present

## 2020-07-11 DIAGNOSIS — E782 Mixed hyperlipidemia: Secondary | ICD-10-CM

## 2020-07-11 DIAGNOSIS — I2119 ST elevation (STEMI) myocardial infarction involving other coronary artery of inferior wall: Secondary | ICD-10-CM

## 2020-07-11 DIAGNOSIS — E785 Hyperlipidemia, unspecified: Secondary | ICD-10-CM | POA: Diagnosis not present

## 2020-07-11 DIAGNOSIS — Z20822 Contact with and (suspected) exposure to covid-19: Secondary | ICD-10-CM | POA: Diagnosis not present

## 2020-07-11 DIAGNOSIS — Z23 Encounter for immunization: Secondary | ICD-10-CM | POA: Diagnosis not present

## 2020-07-11 DIAGNOSIS — I442 Atrioventricular block, complete: Secondary | ICD-10-CM | POA: Diagnosis not present

## 2020-07-11 DIAGNOSIS — I110 Hypertensive heart disease with heart failure: Secondary | ICD-10-CM | POA: Diagnosis not present

## 2020-07-11 DIAGNOSIS — E119 Type 2 diabetes mellitus without complications: Secondary | ICD-10-CM | POA: Diagnosis not present

## 2020-07-11 LAB — CBC
HCT: 37.5 % — ABNORMAL LOW (ref 39.0–52.0)
Hemoglobin: 12.6 g/dL — ABNORMAL LOW (ref 13.0–17.0)
MCH: 31.3 pg (ref 26.0–34.0)
MCHC: 33.6 g/dL (ref 30.0–36.0)
MCV: 93.3 fL (ref 80.0–100.0)
Platelets: 204 10*3/uL (ref 150–400)
RBC: 4.02 MIL/uL — ABNORMAL LOW (ref 4.22–5.81)
RDW: 12.7 % (ref 11.5–15.5)
WBC: 9.9 10*3/uL (ref 4.0–10.5)
nRBC: 0 % (ref 0.0–0.2)

## 2020-07-11 LAB — LIPID PANEL
Cholesterol: 133 mg/dL (ref 0–200)
HDL: 54 mg/dL (ref >40)
LDL Cholesterol: 63 mg/dL (ref 0–99)
Total CHOL/HDL Ratio: 2.5 RATIO
Triglycerides: 79 mg/dL (ref <150)
VLDL: 16 mg/dL (ref 0–40)

## 2020-07-11 LAB — GLUCOSE, CAPILLARY
Glucose-Capillary: 124 mg/dL — ABNORMAL HIGH (ref 70–99)
Glucose-Capillary: 143 mg/dL — ABNORMAL HIGH (ref 70–99)
Glucose-Capillary: 130 mg/dL — ABNORMAL HIGH (ref 70–99)
Glucose-Capillary: 170 mg/dL — ABNORMAL HIGH (ref 70–99)

## 2020-07-11 LAB — BASIC METABOLIC PANEL
Anion gap: 7 (ref 5–15)
BUN: 14 mg/dL (ref 8–23)
CO2: 23 mmol/L (ref 22–32)
Calcium: 8.6 mg/dL — ABNORMAL LOW (ref 8.9–10.3)
Chloride: 106 mmol/L (ref 98–111)
Creatinine, Ser: 0.87 mg/dL (ref 0.61–1.24)
GFR calc Af Amer: >60 mL/min (ref >60)
GFR calc non Af Amer: >60 mL/min (ref >60)
Glucose, Bld: 123 mg/dL — ABNORMAL HIGH (ref 70–99)
Potassium: 4.1 mmol/L (ref 3.5–5.1)
Sodium: 136 mmol/L (ref 135–145)

## 2020-07-11 LAB — ECHOCARDIOGRAM COMPLETE
Area-P 1/2: 3.2 cm2
Calc EF: 30.4 %
Height: 72 in
S' Lateral: 4.5 cm
Single Plane A2C EF: 30.2 %
Single Plane A4C EF: 28.9 %
Weight: 3199.32 oz

## 2020-07-11 LAB — HEMOGLOBIN A1C
Hgb A1c MFr Bld: 6.6 % — ABNORMAL HIGH (ref 4.8–5.6)
Mean Plasma Glucose: 142.72 mg/dL

## 2020-07-11 MED ORDER — SODIUM CHLORIDE 0.9 % IV BOLUS
500.0000 mL | Freq: Once | INTRAVENOUS | Status: AC
  Administered 2020-07-11: 500 mL via INTRAVENOUS

## 2020-07-11 MED ORDER — PERFLUTREN LIPID MICROSPHERE
1.0000 mL | INTRAVENOUS | Status: AC | PRN
  Administered 2020-07-11: 2 mL via INTRAVENOUS
  Filled 2020-07-11: qty 10

## 2020-07-11 NOTE — Progress Notes (Addendum)
Progress Note  Patient Name: Cory Larson Date of Encounter: 07/11/2020  [facility] Cardiologist: Quay Burow, MD   Subjective   The patient is doing very well this morning, walking around without chest pain.   Inpatient Medications    Scheduled Meds: . aspirin  81 mg Oral Daily  . Chlorhexidine Gluconate Cloth  6 each Topical Daily  . heparin  5,000 Units Subcutaneous Q8H  . insulin aspart  0-15 Units Subcutaneous TID WC  . metoprolol tartrate  12.5 mg Oral BID  . pneumococcal 23 valent vaccine  0.5 mL Intramuscular Tomorrow-1000  . rosuvastatin  40 mg Oral Daily  . rosuvastatin  40 mg Oral Daily  . sodium chloride flush  3 mL Intravenous Q12H  . ticagrelor  90 mg Oral BID   Continuous Infusions: . sodium chloride    . sodium chloride     PRN Meds: sodium chloride, acetaminophen, morphine injection, nitroGLYCERIN, ondansetron (ZOFRAN) IV, sodium chloride flush   Vital Signs    Vitals:   07/11/20 0300 07/11/20 0400 07/11/20 0500 07/11/20 0700  BP: 115/68 (!) 105/59    Pulse: (!) 54 (!) 54 (!) 54   Resp: _0 Temp: 98.4 F (36.9 C)   98.1 F (36.7 C)  TempSrc: Oral   Oral  SpO2: 100% 98% 99%   Weight:      Height:        Intake/Output Summary (Last 24 hours) at 07/11/2020 0857 Last data filed at 07/11/2020 0800 Gross per 24 hour  Intake 486.69 ml  Output 520 ml  Net -33.31 ml   Last 3 Weights 07/10/2020 07/10/2020  Weight (lbs) 199 lb 15.3 oz 200 lb  Weight (kg) 90.7 kg 90.719 kg     Telemetry    SR, runs of nsVT - Personally Reviewed  ECG    pending this am - Personally Reviewed  Physical Exam   GEN: No acute distress.   Neck: No JVD Cardiac: RRR, no murmurs, rubs, or gallops.  Respiratory: Clear to auscultation bilaterally. GI: Soft, nontender, non-distended  MS: No edema; No deformity.no bruit above radial access site, no hematoma Neuro:  Nonfocal  Psych: Normal affect   Labs    High Sensitivity Troponin:   Recent Labs   Lab 07/10/20 1114 07/10/20 1606  TROPONINIHS 14 >27,000*      Chemistry Recent Labs  Lab 07/10/20 1114 07/10/20 1606 07/11/20 0121  NA 137  --  136  K 4.0  --  4.1  CL 102  --  106  CO2 22  --  23  GLUCOSE 199*  --  123*  BUN 12  --  14  CREATININE 1.05 0.90 0.87  CALCIUM 9.1  --  8.6*  PROT 7.5  --   --   ALBUMIN 4.1  --   --   AST 26  --   --   ALT 21  --   --   ALKPHOS 68  --   --   BILITOT 0.9  --   --   GFRNONAA >60 >60 >60  GFRAA >60 >60 >60  ANIONGAP 13  --  7     Hematology Recent Labs  Lab 07/10/20 1114 07/10/20 1114 07/10/20 1606 07/10/20 1821 07/11/20 0121  WBC 9.6  --  12.3*  --  9.9  RBC 4.80  --  4.39  --  4.02*  HGB 14.6  --  13.4  --  12.6*  HCT 45.6  --  40.7  --  37.5*  MCV 95.0  --  92.7  --  93.3  MCH 30.4  --  30.5  --  31.3  MCHC 32.0  --  32.9  --  33.6  RDW 12.3  --  12.5  --  12.7  PLT 213   < > 212 200 204   < > = values in this interval not displayed.    BNPNo results for input(s): BNP, PROBNP in the last 168 hours.   DDimer No results for input(s): DDIMER in the last 168 hours.   Radiology    CARDIAC CATHETERIZATION  Result Date: 07/10/2020  Prox RCA lesion is 100% stenosed.  A drug-eluting stent was successfully placed using a SYNERGY XD 3.0X32.  Post intervention, there is a 0% residual stenosis.  There is moderate to severe left ventricular systolic dysfunction.  LV end diastolic pressure is mildly elevated.  The left ventricular ejection fraction is 35-45% by visual estimate.  Cory Larson is a 69 y.o. male  [id number] LOCATION:  FACILITY: [facility] PHYSICIAN: Jonathan Berry, M.D. 12/12/1950 DATE OF PROCEDURE:  07/10/2020 DATE OF DISCHARGE: CARDIAC CATHETERIZATION / PCI DES RCA History obtained from chart review.  69-year-old fit appearing single Caucasian male father of 2 children who works as a finishing carpenter but developed sudden onset chest pain at 8:30 this morning.  History factors include treated diabetes,  hyperlipidemia and untreated hypertension.  He does not smoke.  Never had a heart attack or stroke.  He did dial 911 and was brought by EMS to [facility] emergency room where his EKG showed inferolateral ST segment elevation.  His pain somewhat improved with sublingual nitroglycerin.  He was hypertensive in the ER with a blood pressure of 170/110 although his pain had markedly improved.  He did have persistent ST segment elevation.  He was brought urgently to the Cath Lab for angiography and intervention. PROCEDURE DESCRIPTION: The patient was brought to the second floor [facility] cath lab in the postabsorptive state. He was not premedicated . His right wrist was prepped and shaved in usual sterile fashion. Xylocaine 1% was used for local anesthesia. A 6 French sheath was inserted into the right radial  artery using standard Seldinger technique. The patient received 6000 units  of heparin intravenously.  A 5 French TIG catheter and pigtail catheters were used for selective coronary angiography and left ventriculography respectively.  Isovue dye was used for the entirety of the case.  Retrograde aorta, ventricular and pullback pressures were recorded.  Radial cocktail was administered via the SideArm sheath. The patient received an additional 10,000's of heparin (16,000 units total) with an ACT of over 500.  He received Brilinta 180 mg p.o. and had received aspirin in the emergency room.  Isovue-used for the entirety intervention.  Retroaortic pressures monitored in the case. Using a 6 French JR4 guide catheter, a 0.14/180 cm long [place] guidewire, and a 2 mm x 12 mm balloon the proximal RCA was crossed and predilated.  This did provide antegrade flow it with.  There was a significant amount of intraluminal angiographic thrombus.  I attempted to aspirate this using a "Pronto" mechanical thrombectomy device.  I was able aspirate a significant amount of thrombus.  I then continue to postdilated with a 2.5 x  20 mm long balloon and placed with some difficulty a 3 mm x 32 mm long Synergy drug-eluting stent deployed at 14 atm.  I postdilated with a 3.25 x 20 mm long balloon at 14 atm (3.33   mm) resulting reduction of total occlusion to 0% residual TIMI-3 flow.  The patient did experience significant bradycardia with restoration of antegrade flow which responded nicely to 1 mg of intravenous atropine.  Because of the visible thrombus he was given a bolus of Aggrastat and placed on Aggrastat drip for 6 hours.   Mr. Nay had a large inferolateral STEMI secondary to occlusion of a large dominant RCA with door to balloon time of 36 minutes.  He has mild segmental proximal LAD disease of no significance, a small circumflex and a large trifurcating ramus branch.  His EF is in the 30 to 35% range with severe inferior and inferoapical wall motion abnormality as well as moderate anterior hypokinesia.  He will need to be treated with guideline directed optimal medical therapy including beta-blocker, ACE inhibitor and high-dose statin therapy.  He will need to remain on dual antiplatelet therapy uninterrupted for 12 months.  A 2D echo is pending.  He will most likely be ready for discharge on Monday or Tuesday.  The sheath was removed and a TR band was placed on the right wrist to achieve patent hemostasis.  The patient left lab in stable condition. Quay Burow. MD, [facility] 07/10/2020 12:56 PM    Cardiac Studies   Left cardiac cath: 07/10/2020   Prox RCA lesion is 100% stenosed.  A drug-eluting stent was successfully placed using a SYNERGY XD 3.0X32.  Post intervention, there is a 0% residual stenosis.  There is moderate to severe left ventricular systolic dysfunction.  LV end diastolic pressure is mildly elevated.  The left ventricular ejection fraction is 35-45% by visual estimate.   Cory Larson is a 69 y.o. male   Patient Profile     69 y.o. male post inferior STEMI  Assessment & Plan    CAD, s/p  inferior STEMI on 07/10/2020 - Prox RCA lesion is 100% stenosed.  A drug-eluting stent was successfully placed using a SYNERGY XD 3.0X32.  Post intervention, there is a 0% residual stenosis.  There is moderate to severe left ventricular systolic dysfunction.  LV end diastolic pressure is mildly elevated.  Echo is pending  We will start cardiac PT here and refer for an outpatient rehab, the patient was in great shape prior to admission walking several times a week and playing pickleball for several hours at the time  We will continue DAPT with ASA, Brilinta  Continue high dose atorvastatin  nsVTs, - the patient has several runs of nsVTs on monitor up to 14 beats, he is bradycardic at baseline so hard to start BB, this is most likely reperfusion injury, we will continue to monitor, and if ongoing and LVEF < 35% consider a lifevest at discharge  HLP - started on high dose atorvastatin   For questions or updates, please contact [facility] Please consult [email or web address] for contact info under        Signed, Ena Dawley, MD  07/11/2020, 8:57 AM

## 2020-07-11 NOTE — Progress Notes (Signed)
The patient develop bradycardia with hypotension, patient was symptomatic with diaphoresis dizziness, this lasted for about 30 minutes, his EKG this morning showed normal sinus rhythm, during this episode there was transient third-degree AV block, this has resolved, patient is currently feeling better, will continue to hold metoprolol and give 500 cc of bolus blood pressure has improved to 90s.  I have personally reviewed his bedside echocardiogram that shows LVEF 30 to 35% with akinesis of the basal and mid inferior inferolateral walls as well as apical lateral and inferior walls and apical akinesis as well, Definity echo contrast was used and did not show any apical thrombus.  His RV appears to also have mild to moderate systolic dysfunction.  Tobias Alexander, MD 07/11/2020

## 2020-07-11 NOTE — Progress Notes (Addendum)
The patient received 12.5 mg metop this AM. HR now in upper 40s BP 84/59 MAP 68. Patient pale and cold sweating. No CP, nausea, SOB. EKG will be obtained. MD was paged.       BP improved 104/66 MAP 80. Pt no longer diaphoretic. Remains CP free. EKG obtained.HR remains in 40s. Awaiting return call from MD.    1100 spoke with MD. Orders received for 500 cc bolus. Instructed to hold metop.

## 2020-07-12 ENCOUNTER — Encounter (HOSPITAL_COMMUNITY): Payer: Self-pay | Admitting: Cardiovascular Disease

## 2020-07-12 DIAGNOSIS — I5021 Acute systolic (congestive) heart failure: Secondary | ICD-10-CM

## 2020-07-12 DIAGNOSIS — E119 Type 2 diabetes mellitus without complications: Secondary | ICD-10-CM

## 2020-07-12 DIAGNOSIS — E785 Hyperlipidemia, unspecified: Secondary | ICD-10-CM

## 2020-07-12 LAB — POCT I-STAT, CHEM 8
BUN: 12 mg/dL (ref 8–23)
Calcium, Ion: 1.15 mmol/L (ref 1.15–1.40)
Chloride: 105 mmol/L (ref 98–111)
Creatinine, Ser: 0.6 mg/dL — ABNORMAL LOW (ref 0.61–1.24)
Glucose, Bld: 198 mg/dL — ABNORMAL HIGH (ref 70–99)
HCT: 42 % (ref 39.0–52.0)
Hemoglobin: 14.3 g/dL (ref 13.0–17.0)
Potassium: 4.1 mmol/L (ref 3.5–5.1)
Sodium: 139 mmol/L (ref 135–145)
TCO2: 22 mmol/L (ref 22–32)

## 2020-07-12 LAB — GLUCOSE, CAPILLARY
Glucose-Capillary: 117 mg/dL — ABNORMAL HIGH (ref 70–99)
Glucose-Capillary: 131 mg/dL — ABNORMAL HIGH (ref 70–99)
Glucose-Capillary: 204 mg/dL — ABNORMAL HIGH (ref 70–99)
Glucose-Capillary: 131 mg/dL — ABNORMAL HIGH (ref 70–99)

## 2020-07-12 LAB — POCT ACTIVATED CLOTTING TIME
Activated Clotting Time: 219 seconds
Activated Clotting Time: 588 seconds

## 2020-07-12 MED ORDER — TICAGRELOR 90 MG PO TABS: 90.0000 mg | ORAL_TABLET | Freq: Two times a day (BID) | ORAL | 2 refills | Status: DC

## 2020-07-12 MED ORDER — ROSUVASTATIN CALCIUM 40 MG PO TABS: 40.0000 mg | ORAL_TABLET | Freq: Every day | ORAL | 1 refills | Status: AC

## 2020-07-12 MED ORDER — NITROGLYCERIN 0.4 MG SL SUBL: 0.4000 mg | SUBLINGUAL_TABLET | SUBLINGUAL | 2 refills | Status: DC | PRN

## 2020-07-12 MED ORDER — ASPIRIN 81 MG PO CHEW
81.0000 mg | CHEWABLE_TABLET | Freq: Every day | ORAL | 1 refills | Status: DC
Start: 2020-07-13 — End: 2020-12-14

## 2020-07-12 MED FILL — NITROGLYCERIN 0.4 MG TAB SL: 0.4 | 8 days supply | Qty: 25 | Fill #0

## 2020-07-12 MED FILL — ASPIRIN LOW DOSE 81 MG CHEW: 81 | 90 days supply | Qty: 90 | Fill #0

## 2020-07-12 MED FILL — BRILINTA 90 MG TABLET: 90 | 30 days supply | Qty: 60 | Fill #0

## 2020-07-12 NOTE — Discharge Summary (Addendum)
Discharge Summary    Patient ID: Cory Larson,  MRN: [id number], DOB/AGE: [id number] y.o.  Admit date: 07/10/2020 Discharge date: 07/12/2020  Primary Care Provider: Terrill Mohr A Primary Cardiologist: Dr. [name]   Discharge Diagnoses    Principal Problem:   Acute ST elevation myocardial infarction (STEMI) of inferior wall [facility]) Active Problems:   Acute myocardial infarction ([place])   Heart block AV complete ([place])   Symptomatic bradycardia   Hyperlipidemia   Type 2 diabetes mellitus without complication, without long-term current use of insulin (HCC)   Acute systolic heart failure (HCC)   Allergies No Known Allergies  Diagnostic Studies/Procedures    Cath: 07/10/20   Prox RCA lesion is 100% stenosed.  A drug-eluting stent was successfully placed using a SYNERGY XD 3.0X32.  Post intervention, there is a 0% residual stenosis.  There is moderate to severe left ventricular systolic dysfunction.  LV end diastolic pressure is mildly elevated.  The left ventricular ejection fraction is 35-45% by visual estimate.   IMPRESSION: Cory Larson had a large inferolateral STEMI secondary to occlusion of a large dominant RCA with door to balloon time of 36 minutes.  He has mild segmental proximal LAD disease of no significance, a small circumflex and a large trifurcating ramus branch.  His EF is in the 30 to 35% range with severe inferior and inferoapical wall motion abnormality as well as moderate anterior hypokinesia.  He will need to be treated with guideline directed optimal medical therapy including beta-blocker, ACE inhibitor and high-dose statin therapy.  He will need to remain on dual antiplatelet therapy uninterrupted for 12 months.  A 2D echo is pending.  He will most likely be ready for discharge on Monday or Tuesday.  The sheath was removed and a TR band was placed on the right wrist to achieve patent hemostasis.  The patient left lab in stable condition.  Quay Burow. MD, [facility] 07/10/2020 12:56 PM  Diagnostic Dominance: Right  Intervention    Echo: 07/11/20  IMPRESSIONS    1. LVEF 30-35% with akinesis in the RCA territory, no apical thrombus on  Definity echocontrast images.  2. RVEF moderately reduced.  3. Left ventricular ejection fraction, by estimation, is 30 to 35%. The  left ventricle has moderately decreased function. The left ventricle has  no regional wall motion abnormalities. There is mild concentric left  ventricular hypertrophy. Left  ventricular diastolic parameters are consistent with Grade I diastolic  dysfunction (impaired relaxation).  4. Right ventricular systolic function is moderately reduced. The right  ventricular size is mildly enlarged.  5. The pericardial effusion is posterior to the left ventricle.  6. The mitral valve is normal in structure. Trivial mitral valve  regurgitation. No evidence of mitral stenosis.  7. The aortic valve is normal in structure. Aortic valve regurgitation is  not visualized. No aortic stenosis is present.  8. The inferior vena cava is normal in size with greater than 50%  respiratory variability, suggesting right atrial pressure of 3 mmHg.  _____________   History of Present Illness     Cory Larson is a 69 yo male with no prior cardiac history who experienced onset of chest pain that felt like a squeezing pressure at approximately 0830 the morning of admission, about 30 min after waking. This was associated with diaphoresis and SOB. EMS was dispatched, Tracing with ST elevation in inferior leads. He was given 324 mg ASA and nitro x 1. There was significant improvement in his chest  pain. Upon arrival, he had some mild chest pressure. Repeat EKG with ST elevation III and AVF with ST depression AVL and V6.  He was given 4000 U heparin. CODE STEMI activated.  Hospital Course    He underwent cardiac cath with Dr. [name] noted above with PCI/DES x1 to the pRCA. Started on DAPT with  ASA/Brilinta post cath. hsTn peaked >27000. Follow up echo showed and EF of 30-35% with akinesis in the RCA territory, no apical thrombus noted. RVEF moderately reduced. LDL noted at 63, and statin was increased from Crestor 35m to 473mdaily. Attempted to treat with low dose metoprolol but he did not tolerate. Remained bradycardic with soft blood pressures. Did have episodes of NSVT which improved during admission. Given his low EF he was set up for a lifevest at discharge. Able to ambulate with cardiac rehab without recurrent chest pain. TOC pharmacy used at discharge. Consider adding HF therapy at follow up appt if blood pressures are stable.  _____________  Discharge Vitals Blood pressure 124/76, pulse 75, temperature 98.2 F (36.8 C), temperature source Oral, resp. rate 18, height _0  (1.803 m), weight 92.8 kg, SpO2 100 %.  Filed Weights   07/10/20 1058 07/10/20 1500 07/12/20 1231  Weight: 90.7 kg 90.7 kg 92.8 kg    Labs & Radiologic Studies    CBC Recent Labs    07/10/20 1114 07/10/20 1114 07/10/20 1606 07/10/20 1606 07/10/20 1821 07/11/20 0121  WBC 9.6   < > 12.3*  --   --  9.9  NEUTROABS 7.4  --   --   --   --   --   HGB 14.6   < > 13.4  --   --  12.6*  HCT 45.6   < > 40.7  --   --  37.5*  MCV 95.0   < > 92.7  --   --  93.3  PLT 213   < > 212   < > 200 204   < > = values in this interval not displayed.   Basic Metabolic Panel Recent Labs    07/10/20 1114 07/10/20 1114 07/10/20 1606 07/11/20 0121  NA 137  --   --  136  K 4.0  --   --  4.1  CL 102  --   --  106  CO2 22  --   --  23  GLUCOSE 199*  --   --  123*  BUN 12  --   --  14  CREATININE 1.05   < > 0.90 0.87  CALCIUM 9.1  --   --  8.6*   < > = values in this interval not displayed.   Liver Function Tests Recent Labs    07/10/20 1114  AST 26  ALT 21  ALKPHOS 68  BILITOT 0.9  PROT 7.5  ALBUMIN 4.1   No results for input(s): LIPASE, AMYLASE in the last 72 hours. Cardiac Enzymes No results for  input(s): CKTOTAL, CKMB, CKMBINDEX, TROPONINI in the last 72 hours. BNP Invalid input(s): POCBNP D-Dimer No results for input(s): DDIMER in the last 72 hours. Hemoglobin A1C Recent Labs    07/11/20 0121  HGBA1C 6.6*   Fasting Lipid Panel Recent Labs    07/11/20 0121  CHOL 133  HDL 54  LDLCALC 63  TRIG 79  CHOLHDL 2.5   Thyroid Function Tests No results for input(s): TSH, T4TOTAL, T3FREE, THYROIDAB in the last 72 hours.  Invalid input(s): FREET3 _____________  CARDIAC CATHETERIZATION  Result Date:  07/10/2020  Prox RCA lesion is 100% stenosed.  A drug-eluting stent was successfully placed using a SYNERGY XD 3.0X32.  Post intervention, there is a 0% residual stenosis.  There is moderate to severe left ventricular systolic dysfunction.  LV end diastolic pressure is mildly elevated.  The left ventricular ejection fraction is 35-45% by visual estimate.  Cory Larson is a 69 y.o. male  [id number] LOCATION:  FACILITY: [facility] PHYSICIAN: Quay Burow, M.D. 10-Jul-1951 DATE OF PROCEDURE:  07/10/2020 DATE OF DISCHARGE: CARDIAC CATHETERIZATION / PCI DES RCA History obtained from chart review.  69 year old fit appearing single Caucasian male father of 2 children who works as a Armed forces training and education officer but developed sudden onset chest pain at 8:30 this morning.  History factors include treated diabetes, hyperlipidemia and untreated hypertension.  He does not smoke.  Never had a heart attack or stroke.  He did dial 911 and was brought by EMS to [facility] emergency room where his EKG showed inferolateral ST segment elevation.  His pain somewhat improved with sublingual nitroglycerin.  He was hypertensive in the ER with a blood pressure of 170/110 although his pain had markedly improved.  He did have persistent ST segment elevation.  He was brought urgently to the Cath Lab for angiography and intervention. PROCEDURE DESCRIPTION: The patient was brought to the second floor [facility] cath lab in  the postabsorptive state. He was not premedicated . His right wrist was prepped and shaved in usual sterile fashion. Xylocaine 1% was used for local anesthesia. A 6 French sheath was inserted into the right radial  artery using standard Seldinger technique. The patient received 6000 units  of heparin intravenously.  A 5 [place] TIG catheter and pigtail catheters were used for selective coronary angiography and left ventriculography respectively.  Isovue dye was used for the entirety of the case.  Retrograde aorta, ventricular and pullback pressures were recorded.  Radial cocktail was administered via the SideArm sheath. The patient received an additional 10,000's of heparin (16,000 units total) with an ACT of over 500.  He received Brilinta 180 mg p.o. and had received aspirin in the emergency room.  Isovue-used for the entirety intervention.  Retroaortic pressures monitored in the case. Using a 6 [place] JR4 guide catheter, a 0.14/180 cm long [place] guidewire, and a 2 mm x 12 mm balloon the proximal RCA was crossed and predilated.  This did provide antegrade flow it with.  There was a significant amount of intraluminal angiographic thrombus.  I attempted to aspirate this using a "Pronto" mechanical thrombectomy device.  I was able aspirate a significant amount of thrombus.  I then continue to postdilated with a 2.5 x 20 mm long balloon and placed with some difficulty a 3 mm x 32 mm long Synergy drug-eluting stent deployed at 14 atm.  I postdilated with a 3.25 x 20 mm long balloon at 14 atm (3.33 mm) resulting reduction of total occlusion to 0% residual TIMI-3 flow.  The patient did experience significant bradycardia with restoration of antegrade flow which responded nicely to 1 mg of intravenous atropine.  Because of the visible thrombus he was given a bolus of Aggrastat and placed on Aggrastat drip for 6 hours.   Mr. Witucki had a large inferolateral STEMI secondary to occlusion of a large dominant RCA with door  to balloon time of 36 minutes.  He has mild segmental proximal LAD disease of no significance, a small circumflex and a large trifurcating ramus branch.  His EF is in the  30 to 35% range with severe inferior and inferoapical wall motion abnormality as well as moderate anterior hypokinesia.  He will need to be treated with guideline directed optimal medical therapy including beta-blocker, ACE inhibitor and high-dose statin therapy.  He will need to remain on dual antiplatelet therapy uninterrupted for 12 months.  A 2D echo is pending.  He will most likely be ready for discharge on Monday or Tuesday.  The sheath was removed and a TR band was placed on the right wrist to achieve patent hemostasis.  The patient left lab in stable condition. Quay Burow. MD, [facility] 07/10/2020 12:56 PM   ECHOCARDIOGRAM COMPLETE  Result Date: 07/11/2020    ECHOCARDIOGRAM REPORT   Patient Name:   Cory Larson Date of Exam: 07/11/2020 Medical Rec #:  [id number]        Height:       72.0 in Accession #:    [id number]       Weight:       200.0 lb Date of Birth:  04-03-1951         BSA:          2.130 m Patient Age:    72 years         BP:           92/54 mmHg Patient Gender: M                HR:           50 bpm. Exam Location:  Inpatient Procedure: 2D Echo Indications:    STEMI  History:        Patient has no prior history of Echocardiogram examinations.                 Acute MI. S/P inferior STEMI.  Sonographer:    Darlina Sicilian RDCS Referring Phys: [id number] [place]  1. LVEF 30-35% with akinesis in the RCA territory, no apical thrombus on Definity echocontrast images.  2. RVEF moderately reduced.  3. Left ventricular ejection fraction, by estimation, is 30 to 35%. The left ventricle has moderately decreased function. The left ventricle has no regional wall motion abnormalities. There is mild concentric left ventricular hypertrophy. Left ventricular diastolic parameters are consistent with Grade I diastolic  dysfunction (impaired relaxation).  4. Right ventricular systolic function is moderately reduced. The right ventricular size is mildly enlarged.  5. The pericardial effusion is posterior to the left ventricle.  6. The mitral valve is normal in structure. Trivial mitral valve regurgitation. No evidence of mitral stenosis.  7. The aortic valve is normal in structure. Aortic valve regurgitation is not visualized. No aortic stenosis is present.  8. The inferior vena cava is normal in size with greater than 50% respiratory variability, suggesting right atrial pressure of 3 mmHg. FINDINGS  Left Ventricle: Left ventricular ejection fraction, by estimation, is 30 to 35%. The left ventricle has moderately decreased function. The left ventricle has no regional wall motion abnormalities. The left ventricular internal cavity size was normal in size. There is mild concentric left ventricular hypertrophy. Left ventricular diastolic function could not be evaluated due to nondiagnostic images. Left ventricular diastolic parameters are consistent with Grade I diastolic dysfunction (impaired relaxation). Normal left ventricular filling pressure.  LV Wall Scoring: The mid and distal lateral wall, entire inferior wall, posterior wall, basal inferoseptal segment, and apex are akinetic. Right Ventricle: The right ventricular size is mildly enlarged. No increase in right ventricular wall thickness. Right ventricular systolic  function is moderately reduced. Left Atrium: Left atrial size was normal in size. Right Atrium: Right atrial size was normal in size. Pericardium: Trivial pericardial effusion is present. The pericardial effusion is posterior to the left ventricle. Mitral Valve: The mitral valve is normal in structure. Trivial mitral valve regurgitation. No evidence of mitral valve stenosis. Tricuspid Valve: The tricuspid valve is normal in structure. Tricuspid valve regurgitation is not demonstrated. No evidence of tricuspid  stenosis. Aortic Valve: The aortic valve is normal in structure. Aortic valve regurgitation is not visualized. No aortic stenosis is present. Pulmonic Valve: The pulmonic valve was normal in structure. Pulmonic valve regurgitation is not visualized. No evidence of pulmonic stenosis. Aorta: The aortic root is normal in size and structure. Venous: The inferior vena cava is normal in size with greater than 50% respiratory variability, suggesting right atrial pressure of 3 mmHg. IAS/Shunts: No atrial level shunt detected by color flow Doppler.  LEFT VENTRICLE PLAX 2D LVIDd:         5.00 cm      Diastology LVIDs:         4.50 cm      LV e' medial:    4.70 cm/s LV PW:         1.30 cm      LV E/e' medial:  8.9 LV IVS:        1.10 cm      LV e' lateral:   5.19 cm/s LVOT diam:     2.20 cm      LV E/e' lateral: 8.1 LV SV:         64 LV SV Index:   30 LVOT Area:     3.80 cm  LV Volumes (MOD) LV vol d, MOD A2C: 205.0 ml LV vol d, MOD A4C: 197.0 ml LV vol s, MOD A2C: 143.0 ml LV vol s, MOD A4C: 140.0 ml LV SV MOD A2C:     62.0 ml LV SV MOD A4C:     197.0 ml LV SV MOD BP:      61.6 ml RIGHT VENTRICLE RV S prime:     16.90 cm/s TAPSE (M-mode): 2.2 cm LEFT ATRIUM           Index       RIGHT ATRIUM           Index LA diam:      3.10 cm 1.46 cm/m  RA Area:     17.50 cm LA Vol (A2C): 90.2 ml 42.34 ml/m RA Volume:   50.20 ml  23.56 ml/m LA Vol (A4C): 48.5 ml 22.77 ml/m  AORTIC VALVE LVOT Vmax:   74.40 cm/s LVOT Vmean:  48.000 cm/s LVOT VTI:    0.169 m  AORTA Ao Root diam: 3.50 cm MITRAL VALVE MV Area (PHT): 3.20 cm    SHUNTS MV Decel Time: 237 msec    Systemic VTI:  0.17 m MV E velocity: 42.00 cm/s  Systemic Diam: 2.20 cm MV A velocity: 48.80 cm/s MV E/A ratio:  0.86 Ena Dawley MD Electronically signed by Ena Dawley MD Signature Date/Time: 07/11/2020/12:14:47 PM    Final    Disposition   Pt is being discharged home today in good condition.  Follow-up Plans & Appointments     Follow-up Information    Erlene Quan, PA-C Follow up on 07/23/2020.   Specialties: Cardiology, Radiology Why: at 2:15pm for your follow up appt.  Contact information: [address] [place] [address] [phone]  Discharge Instructions    Amb Referral to Cardiac Rehabilitation   Complete by: As directed    Diagnosis:  Coronary Stents STEMI PTCA     After initial evaluation and assessments completed: Virtual Based Care may be provided alone or in conjunction with Phase 2 Cardiac Rehab based on patient barriers.: Yes   Call MD for:  redness, tenderness, or signs of infection (pain, swelling, redness, odor or green/yellow discharge around incision site)   Complete by: As directed    Diet - low sodium heart healthy   Complete by: As directed    Discharge instructions   Complete by: As directed    Radial Site Care Refer to this sheet in the next few weeks. These instructions provide you with information on caring for yourself after your procedure. Your caregiver may also give you more specific instructions. Your treatment has been planned according to current medical practices, but problems sometimes occur. Call your caregiver if you have any problems or questions after your procedure. HOME CARE INSTRUCTIONS You may shower the day after the procedure.Remove the bandage (dressing) and gently wash the site with plain soap and water.Gently pat the site dry.  Do not apply powder or lotion to the site.  Do not submerge the affected site in water for 3 to 5 days.  Inspect the site at least twice daily.  Do not flex or bend the affected arm for 24 hours.  No lifting over 5 pounds (2.3 kg) for 5 days after your procedure.  What to expect: Any bruising will usually fade within 1 to 2 weeks.  Blood that collects in the tissue (hematoma) may be painful to the touch. It should usually decrease in size and tenderness within 1 to 2 weeks.  SEEK IMMEDIATE MEDICAL CARE IF: You have unusual pain  at the radial site.  You have redness, warmth, swelling, or pain at the radial site.  You have drainage (other than a small amount of blood on the dressing).  You have chills.  You have a fever or persistent symptoms for more than 72 hours.  You have a fever and your symptoms suddenly get worse.  Your arm becomes pale, cool, tingly, or numb.  You have heavy bleeding from the site. Hold pressure on the site.   No driving for 2 weeks. No lifting over 10 lbs for 4 weeks. No sexual activity for 4 weeks. You may not return to work until cleared by your cardiologist Keep procedure site clean & dry. If you notice increased pain, swelling, bleeding or pus, call/return!  You may shower, but no soaking baths/hot tubs/pools for 1 week.   No driving until your follow up appt as you will be wearing a lifevest.   Increase activity slowly   Complete by: As directed       Discharge Medications   Allergies as of 07/12/2020   No Known Allergies     Medication List    STOP taking these medications   sildenafil 50 MG tablet Commonly known as: VIAGRA     TAKE these medications   aspirin 81 MG chewable tablet Chew 1 tablet (81 mg total) by mouth daily. Start taking on: July 13, 2020   glipiZIDE 5 MG 24 hr tablet Commonly known as: GLUCOTROL XL Take 5 mg by mouth daily.   metFORMIN 500 MG tablet Commonly known as: GLUCOPHAGE Take 500 mg by mouth daily.   nitroGLYCERIN 0.4 MG SL tablet Commonly known as: NITROSTAT Place 1 tablet (0.4 mg  total) under the tongue every 5 (five) minutes x 3 doses as needed for chest pain.   rosuvastatin 40 MG tablet Commonly known as: CRESTOR Take 1 tablet (40 mg total) by mouth daily. Start taking on: July 13, 2020 What changed:   medication strength  how much to take   ticagrelor 90 MG Tabs tablet Commonly known as: BRILINTA Take 1 tablet (90 mg total) by mouth 2 (two) times daily.        Aspirin prescribed at discharge?  Yes High  Intensity Statin Prescribed? (Lipitor 40-77m or Crestor 20-432m: Yes Beta Blocker Prescribed? No: bradycardic and soft blood pressures For EF <40%, was ACEI/ARB Prescribed? No: soft blood pressures. Consider adding at outpatient follow up ADP Receptor Inhibitor Prescribed? (i.e. Plavix etc.-Includes Medically Managed Patients): Yes For EF <40%, Aldosterone Inhibitor Prescribed? No: consider adding at outpatient follow up  Was EF assessed during THIS hospitalization? Yes Was Cardiac Rehab II ordered? (Included Medically managed Patients): Yes   Outstanding Labs/Studies   Follow up echo in 3 months  Duration of Discharge Encounter   Greater than 30 minutes including physician time.  Signed, LiReino BellisP-C 07/12/2020, 2:55 PM   I have examined the patient and reviewed assessment and plan and discussed with patient.  Agree with above as stated.    CAD: Continue dual antiplatelet therapy along with aggressive secondary prevention.  High-dose statin.  Acute systolic heart failure: He appears well compensated.  Metoprolol was given to him yesterday and he did not tolerate this.  He had a reaction and the medication was stopped.  Regardless, heart rate has been slow.  Blood pressure has been a little bit low as well.  May need to wait until outpatient visit to initiate heart failure therapy.    Nonsustained ventricular tachycardia: This has improved.  He had a significant amount after the cath.  Given the low ejection fraction and the fact that his PDA supplies the apex, we will evaluate for LifeVest in the setting of a low ejection fraction.  He is aware and willing to use the device.   Life vest was fitted.  He walked with cardiac rehab and felt well.  OK for discharge.  Will have to add some therapy for low systolic function as an outpatient when BP has increased.  Intermittent borderline BP prevents use of Entresto at this time.     JaLarae Grooms

## 2020-07-12 NOTE — Progress Notes (Signed)
CARDIAC REHAB PHASE I   PRE:  Rate/Rhythm: 72 SR with PVCs    BP: sitting 128/75    SaO2:   MODE:  Ambulation: 470 ft   POST:  Rate/Rhythm: 92 SR    BP: sitting 142/73     SaO2: 100 RA  Tolerated well, no c/o. PVCs decreased with ambulation. Discussed MI, stent, restrictions, Brilinta importance, diet, exercise, NTG, lifevest, and CRPII. Pt very receptive. Understands the importance of Brilinta. Will refer to [place] CRPII. He likes to exercise, will walk for now. Gave videos to view regarding stent, MI, and Lifevest. [id number]   Harriet Masson CES, ACSM 07/12/2020 2:15 PM

## 2020-07-12 NOTE — Progress Notes (Signed)
    Chart reviewed, patient with acute inferior MI and EF noted at 30-35% with akinesis in the RCA territory. No apical thrombus. Orders faxed for lifevest and rep notified.   Echo: 07/11/20  1. LVEF 30-35% with akinesis in the RCA territory, no apical thrombus on  Definity echocontrast images.  2. RVEF moderately reduced.  3. Left ventricular ejection fraction, by estimation, is 30 to 35%. The  left ventricle has moderately decreased function. The left ventricle has  no regional wall motion abnormalities. There is mild concentric left  ventricular hypertrophy. Left  ventricular diastolic parameters are consistent with Grade I diastolic  dysfunction (impaired relaxation).  4. Right ventricular systolic function is moderately reduced. The right  ventricular size is mildly enlarged.  5. The pericardial effusion is posterior to the left ventricle.  6. The mitral valve is normal in structure. Trivial mitral valve  regurgitation. No evidence of mitral stenosis.  7. The aortic valve is normal in structure. Aortic valve regurgitation is  not visualized. No aortic stenosis is present.  8. The inferior vena cava is normal in size with greater than 50%  respiratory variability, suggesting right atrial pressure of 3 mmHg.   SignedLaverda Page, NP-C 07/12/2020, 10:29 AM Pager: [phone]

## 2020-07-12 NOTE — Evaluation (Signed)
Physical Therapy Evaluation/ Discharge Patient Details Name: Cory Larson MRN: [id number] DOB: 05/23/1951 Today's Date: 07/12/2020   History of Present Illness  69 yo admitted with chest pain with STEMI. PMHx: HLD, DM, HTN  Clinical Impression  Pt very pleasant, in chair on arrival able to make laps around unit without difficulty and complete 2 stairs. Pt at baseline functional status with HR 84-99 during gait and states he is active with yoga, walking and pickleball. No further acute therapy needs with pt in agreement, will sign off.     Follow Up Recommendations No PT follow up    Equipment Recommendations  None recommended by PT    Recommendations for Other Services       Precautions / Restrictions        Mobility  Bed Mobility               General bed mobility comments: in chair on arrival and end of session  Transfers Overall transfer level: Independent                  Ambulation/Gait Ambulation/Gait assistance: Independent Gait Distance (Feet): 800 Feet Assistive device: None Gait Pattern/deviations: WFL(Within Functional Limits);Decreased stride length Gait velocity: pt with decreased speed with caution due to HR (HR 84-99 during gait) Gait velocity interpretation: >2.62 ft/sec, indicative of community ambulatory    Stairs            Wheelchair Mobility    Modified Rankin (Stroke Patients Only)       Balance Overall balance assessment: No apparent balance deficits (not formally assessed)                                           Pertinent Vitals/Pain Pain Assessment: No/denies pain    Home Living Family/patient expects to be discharged to:: Private residence Living Arrangements: Alone   Type of Home: House Home Access: Stairs to enter Entrance Stairs-Rails: None Entrance Stairs-Number of Steps: 2 Home Layout: One level Home Equipment: Environmental consultant - 2 wheels;Crutches;Cane - single point      Prior Function  Level of Independence: Independent               Hand Dominance        Extremity/Trunk Assessment   Upper Extremity Assessment Upper Extremity Assessment: Overall WFL for tasks assessed    Lower Extremity Assessment Lower Extremity Assessment: Overall WFL for tasks assessed    Cervical / Trunk Assessment Cervical / Trunk Assessment: Normal  Communication   Communication: No difficulties  Cognition Arousal/Alertness: Awake/alert Behavior During Therapy: WFL for tasks assessed/performed Overall Cognitive Status: Within Functional Limits for tasks assessed                                        General Comments      Exercises     Assessment/Plan    PT Assessment Patent does not need any further PT services  PT Problem List         PT Treatment Interventions      PT Goals (Current goals can be found in the Care Plan section)  Acute Rehab PT Goals PT Goal Formulation: All assessment and education complete, DC therapy    Frequency     Barriers to discharge  Co-evaluation               AM-PAC PT "6 Clicks" Mobility  Outcome Measure Help needed turning from your back to your side while in a flat bed without using bedrails?: None Help needed moving from lying on your back to sitting on the side of a flat bed without using bedrails?: None Help needed moving to and from a bed to a chair (including a wheelchair)?: None Help needed standing up from a chair using your arms (e.g., wheelchair or bedside chair)?: None Help needed to walk in hospital room?: None Help needed climbing 3-5 steps with a railing? : None 6 Click Score: 24    End of Session   Activity Tolerance: Patient tolerated treatment well Patient left: Other (comment) (bathroom) Nurse Communication: Mobility status PT Visit Diagnosis: Other abnormalities of gait and mobility (R26.89)    Time: 0211-1552 PT Time Calculation (min) (ACUTE ONLY): 14 min   Charges:    PT Evaluation $PT Eval Low Complexity: 1 Low          Hagen Tidd P, PT Acute Rehabilitation Services Pager: [phone] Office: [phone]   [name] B [name] 07/12/2020, 7:53 AM

## 2020-07-12 NOTE — TOC Initial Note (Signed)
Transition of Care [facility]) - Initial/Assessment Note    Patient Details  Name: Cory Larson MRN: [id number] Date of Birth: [birth year]  [facility] CM/SW Contact:    Janae Bridgeman, RN Phone Number: 07/12/2020, 11:34 AM  Clinical Narrative:                 Case management spoke with the patient at the bedside regarding [employer] savings card - given to patient.  TOC cma sent message regarding [employer] cost.  I spoke with Alvino Chapel, rep with [employer] and clinicals faxed to [phone] to obtaine LI[employer] V[employer] prior to patient's discharge home and transfer out to the telemetry floor today.  Expected Discharge Plan: Home/Self Care Barriers to Discharge: Continued Medical Work up   Patient Goals and CMS Choice Patient states their goals for this hospitalization and ongoing recovery are:: Patient plans to discharge home with [employer] - lives alone. CMS Medicare.gov Compare Post Acute Care list provided to:: Patient Choice offered to / list presented to : Patient  Expected Discharge Plan and Services Expected Discharge Plan: Home/Self Care   Discharge Planning Services: CM Consult Post Acute Care Choice: Durable Medical Equipment Living arrangements for the past 2 months: Single Family Home                 DME Arranged: Life vest (Facesheet, H&P, cardiac results sent to [place] at [facility] on 07/12/2020) DME Agency: [facility] Date DME Agency Contacted: 07/12/20 Time DME Agency Contacted: [phone] Representative spoke with at DME Agency: Mady Haagensen, [employer] - clinicals sent to fax for [employer] set up.            Prior Living Arrangements/Services Living arrangements for the past 2 months: Single Family Home Lives with:: Self Patient language and need for interpreter reviewed:: Yes Do you feel safe going back to the place where you live?: Yes      Need for Family Participation in Patient Care: Yes (Comment) Care giver support system in place?: Yes (comment)   Criminal  Activity/Legal Involvement Pertinent to Current Situation/Hospitalization: No - Comment as needed  Activities of Daily Living Home Assistive Devices/Equipment: None ADL Screening (condition at time of admission) Patient's cognitive ability adequate to safely complete daily activities?: Yes Is the patient deaf or have difficulty hearing?: No Does the patient have difficulty seeing, even when wearing glasses/contacts?: No Does the patient have difficulty concentrating, remembering, or making decisions?: No Patient able to express need for assistance with ADLs?: Yes Does the patient have difficulty dressing or bathing?: No Independently performs ADLs?: Yes (appropriate for developmental age) Does the patient have difficulty walking or climbing stairs?: No Weakness of Legs: None Weakness of Arms/Hands: None  Permission Sought/Granted Permission sought to share information with : Case Manager Permission granted to share information with : Yes, Verbal Permission Granted     Permission granted to share info w AGENCY: [employer] - Alvino Chapel  Permission granted to share info w Relationship: son, Governor Matos - [phone]     Emotional Assessment Appearance:: Appears stated age Attitude/Demeanor/Rapport: Gracious Affect (typically observed): Accepting Orientation: : Oriented to Self, Oriented to Place, Oriented to  Time, Oriented to Situation Alcohol / Substance Use: Alcohol Use Psych Involvement: No (comment)  Admission diagnosis:  Acute ST elevation myocardial infarction (STEMI) involving other coronary artery of inferior wall (HCC) [I21.19] Acute ST elevation myocardial infarction (STEMI) of inferior wall (HCC) [I21.19] Patient Active Problem List   Diagnosis Date Noted  . Heart block AV complete (HCC)   .  Symptomatic bradycardia   . Acute ST elevation myocardial infarction (STEMI) of inferior wall (HCC) 07/10/2020  . Acute myocardial infarction (HCC) 07/10/2020   PCP:  [name], MD Pharmacy:   [facility] [phone] - [place], [place] - [address] AT CORNER OF [facility] [address] [place] [address] Phone: [phone] Fax: [phone]     Social Determinants of Health (SDOH) Interventions    Readmission Risk Interventions Readmission Risk Prevention Plan 07/12/2020  Post Dischage Appt Complete  Medication Screening Complete  Transportation Screening Complete  Some recent data might be hidden

## 2020-07-12 NOTE — Plan of Care (Signed)
  Problem: Education: Goal: Understanding of CV disease, CV risk reduction, and recovery process will improve Outcome: Adequate for Discharge Goal: Individualized Educational Video(s) Outcome: Adequate for Discharge   Problem: Activity: Goal: Ability to return to baseline activity level will improve Outcome: Adequate for Discharge   Problem: Cardiovascular: Goal: Ability to achieve and maintain adequate cardiovascular perfusion will improve Outcome: Adequate for Discharge Goal: Vascular access site(s) Level 0-1 will be maintained Outcome: Adequate for Discharge   Problem: Health Behavior/Discharge Planning: Goal: Ability to safely manage health-related needs after discharge will improve Outcome: Adequate for Discharge   Problem: Education: Goal: Knowledge of General Education information will improve Description: Including pain rating scale, medication(s)/side effects and non-pharmacologic comfort measures Outcome: Adequate for Discharge   Problem: Health Behavior/Discharge Planning: Goal: Ability to manage health-related needs will improve Outcome: Adequate for Discharge   Problem: Clinical Measurements: Goal: Ability to maintain clinical measurements within normal limits will improve Outcome: Adequate for Discharge Goal: Will remain free from infection Outcome: Adequate for Discharge Goal: Diagnostic test results will improve Outcome: Adequate for Discharge Goal: Respiratory complications will improve Outcome: Adequate for Discharge Goal: Cardiovascular complication will be avoided Outcome: Adequate for Discharge   Problem: Activity: Goal: Risk for activity intolerance will decrease Outcome: Adequate for Discharge   Problem: Nutrition: Goal: Adequate nutrition will be maintained Outcome: Adequate for Discharge   Problem: Coping: Goal: Level of anxiety will decrease Outcome: Adequate for Discharge   Problem: Elimination: Goal: Will not experience complications  related to bowel motility Outcome: Adequate for Discharge Goal: Will not experience complications related to urinary retention Outcome: Adequate for Discharge   Problem: Pain Managment: Goal: General experience of comfort will improve Outcome: Adequate for Discharge   Problem: Safety: Goal: Ability to remain free from injury will improve Outcome: Adequate for Discharge   Problem: Skin Integrity: Goal: Risk for impaired skin integrity will decrease Outcome: Adequate for Discharge   Problem: Education: Goal: Understanding of cardiac disease, CV risk reduction, and recovery process will improve Outcome: Adequate for Discharge Goal: Understanding of medication regimen will improve Outcome: Adequate for Discharge Goal: Individualized Educational Video(s) Outcome: Adequate for Discharge   Problem: Activity: Goal: Ability to tolerate increased activity will improve Outcome: Adequate for Discharge   Problem: Cardiac: Goal: Ability to achieve and maintain adequate cardiopulmonary perfusion will improve Outcome: Adequate for Discharge Goal: Vascular access site(s) Level 0-1 will be maintained Outcome: Adequate for Discharge   Problem: Health Behavior/Discharge Planning: Goal: Ability to safely manage health-related needs after discharge will improve Outcome: Adequate for Discharge   

## 2020-07-12 NOTE — Progress Notes (Signed)
Progress Note  Patient Name: LADARREN STEINER Date of Encounter: 07/12/2020  [facility] Cardiologist: Quay Burow, MD   Subjective   Feels well this morning.  Walked with physical therapy.  No chest discomfort.  Inpatient Medications    Scheduled Meds: . aspirin  81 mg Oral Daily  . Chlorhexidine Gluconate Cloth  6 each Topical Daily  . heparin  5,000 Units Subcutaneous Q8H  . insulin aspart  0-15 Units Subcutaneous TID WC  . pneumococcal 23 valent vaccine  0.5 mL Intramuscular Tomorrow-1000  . rosuvastatin  40 mg Oral Daily  . sodium chloride flush  3 mL Intravenous Q12H  . ticagrelor  90 mg Oral BID   Continuous Infusions: . sodium chloride    . sodium chloride     PRN Meds: sodium chloride, acetaminophen, morphine injection, nitroGLYCERIN, ondansetron (ZOFRAN) IV, sodium chloride flush   Vital Signs    Vitals:   07/12/20 0400 07/12/20 0500 07/12/20 0600 07/12/20 0754  BP: (!) 110/56 127/68 110/70   Pulse: (!) 52 (!) 54 (!) 59   Resp: $Remo'12 15 15   'oZKXU$ Temp:    98.4 F (36.9 C)  TempSrc:    Oral  SpO2: 97% 100% 98%   Weight:      Height:        Intake/Output Summary (Last 24 hours) at 07/12/2020 0952 Last data filed at 07/12/2020 0600 Gross per 24 hour  Intake 719.99 ml  Output 1725 ml  Net -1005.01 ml   Last 3 Weights 07/10/2020 07/10/2020  Weight (lbs) 199 lb 15.3 oz 200 lb  Weight (kg) 90.7 kg 90.719 kg      Telemetry    Normal sinus rhythm with PVCs- Personally Reviewed  ECG    Normal sinus rhythm, inferior T wave inversions- Personally Reviewed  Physical Exam   GEN: No acute distress.   Neck: No JVD Cardiac: RRR, no murmurs, rubs, or gallops.  Respiratory: Clear to auscultation bilaterally. GI: Soft, nontender, non-distended  MS: No edema; No deformity.  2+ right radial pulse, no hematoma Neuro:  Nonfocal  Psych: Normal affect   Labs    High Sensitivity Troponin:   Recent Labs  Lab 07/10/20 1114 07/10/20 1606  TROPONINIHS 14  >27,000*      Chemistry Recent Labs  Lab 07/10/20 1114 07/10/20 1606 07/11/20 0121  NA 137  --  136  K 4.0  --  4.1  CL 102  --  106  CO2 22  --  23  GLUCOSE 199*  --  123*  BUN 12  --  14  CREATININE 1.05 0.90 0.87  CALCIUM 9.1  --  8.6*  PROT 7.5  --   --   ALBUMIN 4.1  --   --   AST 26  --   --   ALT 21  --   --   ALKPHOS 68  --   --   BILITOT 0.9  --   --   GFRNONAA >60 >60 >60  GFRAA >60 >60 >60  ANIONGAP 13  --  7     Hematology Recent Labs  Lab 07/10/20 1114 07/10/20 1114 07/10/20 1606 07/10/20 1821 07/11/20 0121  WBC 9.6  --  12.3*  --  9.9  RBC 4.80  --  4.39  --  4.02*  HGB 14.6  --  13.4  --  12.6*  HCT 45.6  --  40.7  --  37.5*  MCV 95.0  --  92.7  --  93.3  MCH  30.4  --  30.5  --  31.3  MCHC 32.0  --  32.9  --  33.6  RDW 12.3  --  12.5  --  12.7  PLT 213   < > 212 200 204   < > = values in this interval not displayed.    BNPNo results for input(s): BNP, PROBNP in the last 168 hours.   DDimer No results for input(s): DDIMER in the last 168 hours.   Radiology    CARDIAC CATHETERIZATION  Result Date: 07/10/2020  Prox RCA lesion is 100% stenosed.  A drug-eluting stent was successfully placed using a SYNERGY XD 3.0X32.  Post intervention, there is a 0% residual stenosis.  There is moderate to severe left ventricular systolic dysfunction.  LV end diastolic pressure is mildly elevated.  The left ventricular ejection fraction is 35-45% by visual estimate.  NEWMAN WAREN is a 69 y.o. male  [id number] LOCATION:  FACILITY: [facility] PHYSICIAN: Quay Burow, M.D. 1950/11/03 DATE OF PROCEDURE:  07/10/2020 DATE OF DISCHARGE: CARDIAC CATHETERIZATION / PCI DES RCA History obtained from chart review.  69 year old fit appearing single Caucasian male father of 2 children who works as a Armed forces training and education officer but developed sudden onset chest pain at 8:30 this morning.  History factors include treated diabetes, hyperlipidemia and untreated hypertension.  He does not  smoke.  Never had a heart attack or stroke.  He did dial 911 and was brought by EMS to [facility] emergency room where his EKG showed inferolateral ST segment elevation.  His pain somewhat improved with sublingual nitroglycerin.  He was hypertensive in the ER with a blood pressure of 170/110 although his pain had markedly improved.  He did have persistent ST segment elevation.  He was brought urgently to the Cath Lab for angiography and intervention. PROCEDURE DESCRIPTION: The patient was brought to the second floor [facility] cath lab in the postabsorptive state. He was not premedicated . His right wrist was prepped and shaved in usual sterile fashion. Xylocaine 1% was used for local anesthesia. A 6 French sheath was inserted into the right radial  artery using standard Seldinger technique. The patient received 6000 units  of heparin intravenously.  A 5 [place] TIG catheter and pigtail catheters were used for selective coronary angiography and left ventriculography respectively.  Isovue dye was used for the entirety of the case.  Retrograde aorta, ventricular and pullback pressures were recorded.  Radial cocktail was administered via the SideArm sheath. The patient received an additional 10,000's of heparin (16,000 units total) with an ACT of over 500.  He received Brilinta 180 mg p.o. and had received aspirin in the emergency room.  Isovue-used for the entirety intervention.  Retroaortic pressures monitored in the case. Using a 6 [place] JR4 guide catheter, a 0.14/180 cm long P[place]ater guidewire, and a 2 mm x 12 mm balloon the proximal RCA was crossed and predilated.  This did provide antegrade flow it with.  There was a significant amount of intraluminal angiographic thrombus.  I attempted to aspirate this using a "Pronto" mechanical thrombectomy device.  I was able aspirate a significant amount of thrombus.  I then continue to postdilated with a 2.5 x 20 mm long balloon and placed with some difficulty a 3  mm x 32 mm long Synergy drug-eluting stent deployed at 14 atm.  I postdilated with a 3.25 x 20 mm long balloon at 14 atm (3.33 mm) resulting reduction of total occlusion to 0% residual TIMI-3 flow.  The patient  did experience significant bradycardia with restoration of antegrade flow which responded nicely to 1 mg of intravenous atropine.  Because of the visible thrombus he was given a bolus of Aggrastat and placed on Aggrastat drip for 6 hours.   Mr. Clayson had a large inferolateral STEMI secondary to occlusion of a large dominant RCA with door to balloon time of 36 minutes.  He has mild segmental proximal LAD disease of no significance, a small circumflex and a large trifurcating ramus branch.  His EF is in the 30 to 35% range with severe inferior and inferoapical wall motion abnormality as well as moderate anterior hypokinesia.  He will need to be treated with guideline directed optimal medical therapy including beta-blocker, ACE inhibitor and high-dose statin therapy.  He will need to remain on dual antiplatelet therapy uninterrupted for 12 months.  A 2D echo is pending.  He will most likely be ready for discharge on Monday or Tuesday.  The sheath was removed and a TR band was placed on the right wrist to achieve patent hemostasis.  The patient left lab in stable condition. Quay Burow. MD, [facility] 07/10/2020 12:56 PM   ECHOCARDIOGRAM COMPLETE  Result Date: 07/11/2020    ECHOCARDIOGRAM REPORT   Patient Name:   JOHNWESLEY LEDERMAN Date of Exam: 07/11/2020 Medical Rec #:  [id number]        Height:       72.0 in Accession #:    [id number]       Weight:       200.0 lb Date of Birth:  1951/07/16         BSA:          2.130 m Patient Age:    69 years         BP:           92/54 mmHg Patient Gender: M                HR:           50 bpm. Exam Location:  Inpatient Procedure: 2D Echo Indications:    STEMI  History:        Patient has no prior history of Echocardiogram examinations.                 Acute MI. S/P inferior  STEMI.  Sonographer:    Darlina Sicilian RDCS Referring Phys: [id number] [place]  1. LVEF 30-35% with akinesis in the RCA territory, no apical thrombus on Definity echocontrast images.  2. RVEF moderately reduced.  3. Left ventricular ejection fraction, by estimation, is 30 to 35%. The left ventricle has moderately decreased function. The left ventricle has no regional wall motion abnormalities. There is mild concentric left ventricular hypertrophy. Left ventricular diastolic parameters are consistent with Grade I diastolic dysfunction (impaired relaxation).  4. Right ventricular systolic function is moderately reduced. The right ventricular size is mildly enlarged.  5. The pericardial effusion is posterior to the left ventricle.  6. The mitral valve is normal in structure. Trivial mitral valve regurgitation. No evidence of mitral stenosis.  7. The aortic valve is normal in structure. Aortic valve regurgitation is not visualized. No aortic stenosis is present.  8. The inferior vena cava is normal in size with greater than 50% respiratory variability, suggesting right atrial pressure of 3 mmHg. FINDINGS  Left Ventricle: Left ventricular ejection fraction, by estimation, is 30 to 35%. The left ventricle has moderately decreased function. The left ventricle has no regional wall  motion abnormalities. The left ventricular internal cavity size was normal in size. There is mild concentric left ventricular hypertrophy. Left ventricular diastolic function could not be evaluated due to nondiagnostic images. Left ventricular diastolic parameters are consistent with Grade I diastolic dysfunction (impaired relaxation). Normal left ventricular filling pressure.  LV Wall Scoring: The mid and distal lateral wall, entire inferior wall, posterior wall, basal inferoseptal segment, and apex are akinetic. Right Ventricle: The right ventricular size is mildly enlarged. No increase in right ventricular wall thickness.  Right ventricular systolic function is moderately reduced. Left Atrium: Left atrial size was normal in size. Right Atrium: Right atrial size was normal in size. Pericardium: Trivial pericardial effusion is present. The pericardial effusion is posterior to the left ventricle. Mitral Valve: The mitral valve is normal in structure. Trivial mitral valve regurgitation. No evidence of mitral valve stenosis. Tricuspid Valve: The tricuspid valve is normal in structure. Tricuspid valve regurgitation is not demonstrated. No evidence of tricuspid stenosis. Aortic Valve: The aortic valve is normal in structure. Aortic valve regurgitation is not visualized. No aortic stenosis is present. Pulmonic Valve: The pulmonic valve was normal in structure. Pulmonic valve regurgitation is not visualized. No evidence of pulmonic stenosis. Aorta: The aortic root is normal in size and structure. Venous: The inferior vena cava is normal in size with greater than 50% respiratory variability, suggesting right atrial pressure of 3 mmHg. IAS/Shunts: No atrial level shunt detected by color flow Doppler.  LEFT VENTRICLE PLAX 2D LVIDd:         5.00 cm      Diastology LVIDs:         4.50 cm      LV e' medial:    4.70 cm/s LV PW:         1.30 cm      LV E/e' medial:  8.9 LV IVS:        1.10 cm      LV e' lateral:   5.19 cm/s LVOT diam:     2.20 cm      LV E/e' lateral: 8.1 LV SV:         64 LV SV Index:   30 LVOT Area:     3.80 cm  LV Volumes (MOD) LV vol d, MOD A2C: 205.0 ml LV vol d, MOD A4C: 197.0 ml LV vol s, MOD A2C: 143.0 ml LV vol s, MOD A4C: 140.0 ml LV SV MOD A2C:     62.0 ml LV SV MOD A4C:     197.0 ml LV SV MOD BP:      61.6 ml RIGHT VENTRICLE RV S prime:     16.90 cm/s TAPSE (M-mode): 2.2 cm LEFT ATRIUM           Index       RIGHT ATRIUM           Index LA diam:      3.10 cm 1.46 cm/m  RA Area:     17.50 cm LA Vol (A2C): 90.2 ml 42.34 ml/m RA Volume:   50.20 ml  23.56 ml/m LA Vol (A4C): 48.5 ml 22.77 ml/m  AORTIC VALVE LVOT Vmax:    74.40 cm/s LVOT Vmean:  48.000 cm/s LVOT VTI:    0.169 m  AORTA Ao Root diam: 3.50 cm MITRAL VALVE MV Area (PHT): 3.20 cm    SHUNTS MV Decel Time: 237 msec    Systemic VTI:  0.17 m MV E velocity: 42.00 cm/s  Systemic Diam: 2.20 cm MV A  velocity: 48.80 cm/s MV E/A ratio:  0.86 Ena Dawley MD Electronically signed by Ena Dawley MD Signature Date/Time: 07/11/2020/12:14:47 PM    Final     Cardiac Studies   Ejection fraction 30 to 35% by echocardiogram.  I personally reviewed the cath films and his RCA is large and the PDA supplies the apex.  LV gram shows apical hypokinesis and this is confirmed by the echocardiogram.  Patient Profile     69 y.o. male acute inferior wall MI  Assessment & Plan    CAD: Continue dual antiplatelet therapy along with aggressive secondary prevention.  High-dose statin.  Acute systolic heart failure: He appears well compensated.  Metoprolol was given to him yesterday and he did not tolerate this.  He had a reaction and the medication was stopped.  Regardless, heart rate has been slow.  Blood pressure has been a little bit low as well.  May need to wait until outpatient visit to initiate heart failure therapy.    Nonsustained ventricular tachycardia: This has improved.  He had a significant amount after the cath.  Given the low ejection fraction and the fact that his PDA supplies the apex, we will evaluate for LifeVest in the setting of a low ejection fraction.  He is aware and willing to use the device.  Given that he walked around today and it will be more than 48 hours that he was in the hospital, may be able to be discharged later today if we are able to get LifeVest arranged.  If not, we will transfer him to telemetry and plan for discharge tomorrow.  For questions or updates, please contact [place] Please consult [email or web address] for contact info under        Signed, Larae Grooms, MD  07/12/2020, 9:52 AM

## 2020-07-12 NOTE — TOC Transition Note (Signed)
Transition of Care [facility]) - CM/SW Discharge Note   Patient Details  Name: Cory Larson MRN: [id number] Date of Birth: Nov 24, 1950  [facility] of [facility] CM/SW Contact:  Zenon Mayo, RN Phone Number: 07/12/2020, 3:26 PM   Clinical Narrative:    Patient is waiting on life vest approval, once that has been received he will be discharged home.  NCM spoke with him about brilinta refill cost.  TOC filling the first 30 days free for him with the coupon.  Then refills will be estimated cost at 250. 00 , he has deductible of 150 to be met then cost will be 100.00.    Final next level of care: Home/Self Care Barriers to Discharge: No Barriers Identified   Patient Goals and CMS Choice Patient states their goals for this hospitalization and ongoing recovery are:: get better CMS Medicare.gov Compare Post Acute Care list provided to:: Patient Choice offered to / list presented to : NA  Discharge Placement                       Discharge Plan and Services   Discharge Planning Services: CM Consult Post Acute Care Choice: Durable Medical Equipment          DME Arranged: Life vest (Facesheet, H&P, cardiac results sent to [facility] [facility] [employer] at [facility] on 07/12/2020) DME Agency: NA Date DME Agency Contacted: 07/12/20 Time DME Agency Contacted: 1132 Representative spoke with at DME Agency: Debbora Dus, [place] sent to fax for [facility] set up. HH Arranged: NA          Social Determinants of Health (SDOH) Interventions     Readmission Risk Interventions Readmission Risk Prevention Plan 07/12/2020  Post Dischage Appt Complete  Medication Screening Complete  Transportation Screening Complete  Some recent data might be hidden

## 2020-07-12 NOTE — TOC Benefit Eligibility Note (Signed)
Transition of Care [facility]) Benefit Eligibility Note    Patient Details  Name: Cory Larson MRN: [id number] Date of Birth: 08-23-1951   Medication/Dose: Kary Kos 62m bid  Covered?: Yes     Prescription Coverage [facility]  Spoke with Person/Company/Phone Number:: [facility]  Co-Pay: $250 for 30 day retail, patient will have a $100 co-pay once deductible is met  Prior Approval: No  Deductible: Unmet ($150 deductible that has not been met)       MDelorse LekPhone Number: 07/12/2020, 2:22 PM

## 2020-07-12 NOTE — Plan of Care (Signed)
Problem: Education: Goal: Understanding of CV disease, CV risk reduction, and recovery process will improve 07/12/2020 1543 by Saunders Revel, RN Outcome: Adequate for Discharge 07/12/2020 1539 by Saunders Revel, RN Outcome: Adequate for Discharge Goal: Individualized Educational Video(s) 07/12/2020 1543 by Saunders Revel, RN Outcome: Adequate for Discharge 07/12/2020 1539 by Saunders Revel, RN Outcome: Adequate for Discharge   Problem: Activity: Goal: Ability to return to baseline activity level will improve 07/12/2020 1543 by Saunders Revel, RN Outcome: Adequate for Discharge 07/12/2020 1539 by Saunders Revel, RN Outcome: Adequate for Discharge   Problem: Cardiovascular: Goal: Ability to achieve and maintain adequate cardiovascular perfusion will improve 07/12/2020 1543 by Saunders Revel, RN Outcome: Adequate for Discharge 07/12/2020 1539 by Saunders Revel, RN Outcome: Adequate for Discharge Goal: Vascular access site(s) Level 0-1 will be maintained 07/12/2020 1543 by Saunders Revel, RN Outcome: Adequate for Discharge 07/12/2020 1539 by Saunders Revel, RN Outcome: Adequate for Discharge   Problem: Health Behavior/Discharge Planning: Goal: Ability to safely manage health-related needs after discharge will improve 07/12/2020 1543 by Saunders Revel, RN Outcome: Adequate for Discharge 07/12/2020 1539 by Saunders Revel, RN Outcome: Adequate for Discharge   Problem: Education: Goal: Knowledge of General Education information will improve Description: Including pain rating scale, medication(s)/side effects and non-pharmacologic comfort measures 07/12/2020 1543 by Saunders Revel, RN Outcome: Adequate for Discharge 07/12/2020 1539 by Saunders Revel, RN Outcome: Adequate for Discharge   Problem: Health Behavior/Discharge Planning: Goal: Ability to manage health-related needs will improve 07/12/2020 1543 by Saunders Revel,  RN Outcome: Adequate for Discharge 07/12/2020 1539 by Saunders Revel, RN Outcome: Adequate for Discharge   Problem: Clinical Measurements: Goal: Ability to maintain clinical measurements within normal limits will improve 07/12/2020 1543 by Saunders Revel, RN Outcome: Adequate for Discharge 07/12/2020 1539 by Saunders Revel, RN Outcome: Adequate for Discharge Goal: Will remain free from infection 07/12/2020 1543 by Saunders Revel, RN Outcome: Adequate for Discharge 07/12/2020 1539 by Saunders Revel, RN Outcome: Adequate for Discharge Goal: Diagnostic test results will improve 07/12/2020 1543 by Saunders Revel, RN Outcome: Adequate for Discharge 07/12/2020 1539 by Saunders Revel, RN Outcome: Adequate for Discharge Goal: Respiratory complications will improve 07/12/2020 1543 by Saunders Revel, RN Outcome: Adequate for Discharge 07/12/2020 1539 by Saunders Revel, RN Outcome: Adequate for Discharge Goal: Cardiovascular complication will be avoided 07/12/2020 1543 by Saunders Revel, RN Outcome: Adequate for Discharge 07/12/2020 1539 by Saunders Revel, RN Outcome: Adequate for Discharge   Problem: Activity: Goal: Risk for activity intolerance will decrease 07/12/2020 1543 by Saunders Revel, RN Outcome: Adequate for Discharge 07/12/2020 1539 by Saunders Revel, RN Outcome: Adequate for Discharge   Problem: Nutrition: Goal: Adequate nutrition will be maintained 07/12/2020 1543 by Saunders Revel, RN Outcome: Adequate for Discharge 07/12/2020 1539 by Saunders Revel, RN Outcome: Adequate for Discharge   Problem: Coping: Goal: Level of anxiety will decrease 07/12/2020 1543 by Saunders Revel, RN Outcome: Adequate for Discharge 07/12/2020 1539 by Saunders Revel, RN Outcome: Adequate for Discharge   Problem: Elimination: Goal: Will not experience complications related to bowel motility 07/12/2020 1543 by Saunders Revel,  RN Outcome: Adequate for Discharge 07/12/2020 1539 by Saunders Revel, RN Outcome: Adequate for Discharge Goal: Will not experience complications related to urinary retention 07/12/2020 1543 by Saunders Revel, RN Outcome: Adequate for Discharge 07/12/2020 1539 by Saunders Revel, RN Outcome: Adequate for  Discharge   Problem: Pain Managment: Goal: General experience of comfort will improve 07/12/2020 1543 by Saunders Revel, RN Outcome: Adequate for Discharge 07/12/2020 1539 by Saunders Revel, RN Outcome: Adequate for Discharge   Problem: Safety: Goal: Ability to remain free from injury will improve 07/12/2020 1543 by Saunders Revel, RN Outcome: Adequate for Discharge 07/12/2020 1539 by Saunders Revel, RN Outcome: Adequate for Discharge   Problem: Skin Integrity: Goal: Risk for impaired skin integrity will decrease 07/12/2020 1543 by Saunders Revel, RN Outcome: Adequate for Discharge 07/12/2020 1539 by Saunders Revel, RN Outcome: Adequate for Discharge   Problem: Education: Goal: Understanding of cardiac disease, CV risk reduction, and recovery process will improve 07/12/2020 1543 by Saunders Revel, RN Outcome: Adequate for Discharge 07/12/2020 1539 by Saunders Revel, RN Outcome: Adequate for Discharge Goal: Understanding of medication regimen will improve 07/12/2020 1543 by Saunders Revel, RN Outcome: Adequate for Discharge 07/12/2020 1539 by Saunders Revel, RN Outcome: Adequate for Discharge Goal: Individualized Educational Video(s) 07/12/2020 1543 by Saunders Revel, RN Outcome: Adequate for Discharge 07/12/2020 1539 by Saunders Revel, RN Outcome: Adequate for Discharge   Problem: Activity: Goal: Ability to tolerate increased activity will improve 07/12/2020 1543 by Saunders Revel, RN Outcome: Adequate for Discharge 07/12/2020 1539 by Saunders Revel, RN Outcome: Adequate for Discharge   Problem: Cardiac: Goal:  Ability to achieve and maintain adequate cardiopulmonary perfusion will improve 07/12/2020 1543 by Saunders Revel, RN Outcome: Adequate for Discharge 07/12/2020 1539 by Saunders Revel, RN Outcome: Adequate for Discharge Goal: Vascular access site(s) Level 0-1 will be maintained 07/12/2020 1543 by Saunders Revel, RN Outcome: Adequate for Discharge 07/12/2020 1539 by Saunders Revel, RN Outcome: Adequate for Discharge   Problem: Health Behavior/Discharge Planning: Goal: Ability to safely manage health-related needs after discharge will improve 07/12/2020 1543 by Saunders Revel, RN Outcome: Adequate for Discharge 07/12/2020 1539 by Saunders Revel, RN Outcome: Adequate for Discharge

## 2020-07-23 ENCOUNTER — Ambulatory Visit: Payer: Medicare HMO | Admitting: Cardiology

## 2020-07-23 ENCOUNTER — Encounter: Payer: Self-pay | Admitting: Cardiology

## 2020-07-23 ENCOUNTER — Other Ambulatory Visit: Payer: Self-pay

## 2020-07-23 VITALS — BP 130/58 | HR 79 | Temp 97.9°F | Ht 71.0 in | Wt 199.4 lb

## 2020-07-23 DIAGNOSIS — I493 Ventricular premature depolarization: Secondary | ICD-10-CM | POA: Diagnosis not present

## 2020-07-23 DIAGNOSIS — I255 Ischemic cardiomyopathy: Secondary | ICD-10-CM | POA: Diagnosis not present

## 2020-07-23 DIAGNOSIS — E119 Type 2 diabetes mellitus without complications: Secondary | ICD-10-CM | POA: Diagnosis not present

## 2020-07-23 DIAGNOSIS — I442 Atrioventricular block, complete: Secondary | ICD-10-CM

## 2020-07-23 DIAGNOSIS — I2510 Atherosclerotic heart disease of native coronary artery without angina pectoris: Secondary | ICD-10-CM | POA: Diagnosis not present

## 2020-07-23 DIAGNOSIS — I5021 Acute systolic (congestive) heart failure: Secondary | ICD-10-CM

## 2020-07-23 DIAGNOSIS — E785 Hyperlipidemia, unspecified: Secondary | ICD-10-CM

## 2020-07-23 DIAGNOSIS — R001 Bradycardia, unspecified: Secondary | ICD-10-CM

## 2020-07-23 DIAGNOSIS — I2119 ST elevation (STEMI) myocardial infarction involving other coronary artery of inferior wall: Secondary | ICD-10-CM

## 2020-07-23 DIAGNOSIS — Z9861 Coronary angioplasty status: Secondary | ICD-10-CM

## 2020-07-23 MED ORDER — CARVEDILOL 3.125 MG PO TABS: 3.1250 mg | ORAL_TABLET | Freq: Two times a day (BID) | ORAL | 3 refills | Status: DC

## 2020-07-23 NOTE — Assessment & Plan Note (Signed)
Admitted 07/10/2020 with acute inferior MI with CHF

## 2020-07-23 NOTE — Assessment & Plan Note (Signed)
LDL 63 (on Crestor prior to admission)

## 2020-07-23 NOTE — Assessment & Plan Note (Signed)
Intolerant to BB during hospitalization- try low dose Coreg

## 2020-07-23 NOTE — Assessment & Plan Note (Signed)
On Metformin prior to admission Sept 2021

## 2020-07-23 NOTE — Progress Notes (Signed)
Cardiology Office Note:    Date:  07/23/2020   ID:  DARVIN DIALS, DOB Jan 04, 1951, MRN [id number]  PCP:  [name], MD  Cardiologist:  [name], MD  Electrophysiologist:  None   Referring MD: [name], MD   No chief complaint on file. Post hospital follow up  History of Present Illness:    Cory Larson is a 69 y.o. male with a hx of with a hx of non-insulin-dependent diabetes and treated dyslipidemia.  He presented to [facility] ER 07/10/2020 with an acute inferior MI.  His course was complicated by acute heart failure and hypotension.  He underwent urgent catheterization with PCI and DES to his RCA.  He had no other significant coronary disease noted.  His ejection fraction was depressed, echocardiogram 07/11/2020 shows EF to be 30 to 35%.  He was placed on beta-blockers in the hospital but had a reaction to it became nauseous and sweaty.  He was hypotensive post MI.  He did have PVCs and nonsustained VT post MI, he was discharged on a LifeVest.  Patient presents today for follow-up.  He says he has been doing well since discharge.  He has been out walking with no problems.  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   Past Medical History:  Diagnosis Date  . Diabetes mellitus without complication (HCC)   . High cholesterol   . Hyperlipidemia   . Hypertension   . STEMI (ST elevation myocardial infarction) (HCC)   . Systolic heart failure [facility])     Past Surgical History:  Procedure Laterality Date  . CORONARY STENT INTERVENTION N/A 07/10/2020   Procedure: CORONARY STENT INTERVENTION;  Surgeon: Runell Gess, MD;  [facility]: [facility] INV[facility] CV LAB;  Service: Cardiovascular;  Laterality: N/A;  . CORONARY THROMBECTOMY N/A 07/10/2020   Procedure: Coronary Thrombectomy;  Surgeon: Runell Gess, MD;  Location: [facility] INVAS[facility] CV LAB;  Service: Cardiovascular;  Laterality: N/A;  . CORONARY/GRAFT  ACUTE MI REVASCULARIZATION N/A 07/10/2020   Procedure: Coronary/Graft Acute MI Revascularization;  Surgeon: Runell Gess, MD;  [facility]: [facility] LAB;  Service: Cardiovascular;  Laterality: N/A;  . LEFT HEART CATH AND CORONARY ANGIOGRAPHY N/A 07/10/2020   Procedure: LEFT HEART CATH AND CORONARY ANGIOGRAPHY;  Surgeon: Runell Gess, MD;  [facility]: [facility] CV LAB;  Service: Cardiovascular;  Laterality: N/A;    Current Medications: Current Meds  Medication Sig  . aspirin 81 MG chewable tablet Chew 1 tablet (81 mg total) by mouth daily.  Marland Kitchen glipiZIDE (GLUCOTROL XL) 5 MG 24 hr tablet Take 5 mg by mouth daily.  . metFORMIN (GLUCOPHAGE) 500 MG tablet Take 500 mg by mouth daily.  . nitroGLYCERIN (NITROSTAT) 0.4 MG SL tablet Place 1 tablet (0.4 mg total) under the tongue every 5 (five) minutes x 3 doses as needed for chest pain.  . rosuvastatin (CRESTOR) 40 MG tablet Take 1 tablet (40 mg total) by mouth daily.  . ticagrelor (BRILINTA) 90 MG TABS tablet Take 1 tablet (90 mg total) by mouth 2 (two) times daily.     Allergies:   Patient has no known allergies.   Social History   Socioeconomic History  . Marital status: Single    Spouse name: Not on file  . Number of children: Not on file  . Years of education: Not on file  . Highest education level: Not on file  Occupational History  .  Not on file  Tobacco Use  . Smoking status: Never Smoker  . Smokeless tobacco: Never Used  Substance and Sexual Activity  . Alcohol use: Not on file  . Drug use: Not on file  . Sexual activity: Not on file  Other Topics Concern  . Not on file  Social History Narrative  . Not on file   Social Determinants of Health   Financial Resource Strain:   . Difficulty of Paying Living Expenses: Not on file  Food Insecurity:   . Worried About Programme researcher, broadcasting/film/video in the Last Year: Not on file  . Ran Out of Food in the Last Year: Not on file  Transportation Needs:   . Lack of Transportation  (Medical): Not on file  . Lack of Transportation (Non-Medical): Not on file  Physical Activity:   . Days of Exercise per Week: Not on file  . Minutes of Exercise per Session: Not on file  Stress:   . Feeling of Stress : Not on file  Social Connections:   . Frequency of Communication with Friends and Family: Not on file  . Frequency of Social Gatherings with Friends and Family: Not on file  . Attends Religious Services: Not on file  . Active Member of Clubs or Organizations: Not on file  . Attends Banker Meetings: Not on file  . Marital Status: Not on file     Family History: The patient's family history includes Heart disease in his paternal grandfather.  ROS:   Please see the history of present illness.     All other systems reviewed and are negative.  EKGs/Labs/Other Studies Reviewed:    The following studies were reviewed today: Cath/ PCI 07/10/2020 Echo 07/10/2020  EKG: The ekg ordered today demonstrates NSR, HR 70, inferior TWI  Recent Labs: 07/10/2020: ALT 21 07/11/2020: BUN 14; Creatinine, Ser 0.87; Hemoglobin 12.6; Platelets 204; Potassium 4.1; Sodium 136  Recent Lipid Panel    Component Value Date/Time   CHOL 133 07/11/2020 0121   TRIG 79 07/11/2020 0121   HDL 54 07/11/2020 0121   CHOLHDL 2.5 07/11/2020 0121   VLDL 16 07/11/2020 0121   LDLCALC 63 07/11/2020 0121    Physical Exam:    VS:  BP (!) 130/58   Pulse 79   Temp 97.9 F (36.6 C)   Ht 5\' 11"  (1.803 m)   Wt 199 lb 6.4 oz (90.4 kg)   SpO2 97%   BMI 27.81 kg/m     Wt Readings from Last 3 Encounters:  07/23/20 199 lb 6.4 oz (90.4 kg)  07/12/20 204 lb 8 oz (92.8 kg)    B/P by me 132/70  GEN:  Well nourished, well developed in no acute distress HEENT: Normal NECK: No JVD; No carotid bruits CARDIAC: RRR, no murmurs, rubs, gallops RESPIRATORY:  Clear to auscultation without rales, wheezing or rhonchi  ABDOMEN: Soft, non-tender, non-distended MUSCULOSKELETAL:  No edema; No deformity   SKIN: Warm and dry NEUROLOGIC:  Alert and oriented x 3 PSYCHIATRIC:  Normal affect   ASSESSMENT:    Acute STEMI of inferior wall  Admitted 07/10/2020 with acute inferior MI with CHF  Acute systolic heart failure (HCC) CHF on admission 07/10/2020  Ischemic cardiomyopathy EF 30-35% by echo 07/11/2020  CAD S/P percutaneous coronary angioplasty RCA PCI with DES 07/10/2020 -(single vessel CAD)  Symptomatic bradycardia Intolerant to BB during hospitalization- try low dose Coreg  PVC's (premature ventricular contractions) And NSVT post MI.  Unable to tolerate BB (low B/P  and SB).  Discharged with Life Vest Add BB today  Non-insulin dependent type 2 diabetes mellitus (HCC) On Metformin prior to admission Sept 2021  Dyslipidemia, goal LDL below 70 LDL 63 (on Crestor prior to admission)  PLAN:    Add Coreg 3.125 mg BID.  Virtual visit in 1-2 weeks, consider adding Entresto then.  Check echo in 2 months for LVF.    Medication Adjustments/Labs and Tests Ordered: Current medicines are reviewed at length with the patient today.  Concerns regarding medicines are outlined above.  Orders Placed This Encounter  Procedures  . EKG 12-Lead   Meds ordered this encounter  Medications  . carvedilol (COREG) 3.125 MG tablet    Sig: Take 1 tablet (3.125 mg total) by mouth 2 (two) times daily.    Dispense:  60 tablet    Refill:  3    Patient Instructions   Medication Instructions:   START Carvedilol 3.125 mg twice daily.  *If you need a refill on your cardiac medications before your next appointment, please call your pharmacy*   Follow-Up: At [facility], you and your health needs are our priority.  As part of our continuing mission to provide you with exceptional heart care, we have created designated Provider Care Teams.  These Care Teams include your primary Cardiologist (physician) and Advanced Practice Providers (APPs -  Physician Assistants and Nurse Practitioners) who all work  together to provide you with the care you need, when you need it.  We recommend signing up for the patient portal called "MyChart".  Sign up information is provided on this After Visit Summary.  MyChart is used to connect with patients for Virtual Visits (Telemedicine).  Patients are able to view lab/test results, encounter notes, upcoming appointments, etc.  Non-urgent messages can be sent to your provider as well.   To learn more about what you can do with MyChart, go to [email or web address].    Your next appointment:   Wednesday, 08/04/20 at 11:45 AM  The format for your next appointment:   Virtual Visit   Provider:   Corine Shelter, PA-C        Signed, Corine Shelter, PA-C  07/23/2020 2:36 PM    [facility]

## 2020-07-23 NOTE — Patient Instructions (Addendum)
Medication Instructions:   START Carvedilol 3.125 mg twice daily.  *If you need a refill on your cardiac medications before your next appointment, please call your pharmacy*   Follow-Up: At [facility], you and your health needs are our priority.  As part of our continuing mission to provide you with exceptional heart care, we have created designated Provider Care Teams.  These Care Teams include your primary Cardiologist (physician) and Advanced Practice Providers (APPs -  Physician Assistants and Nurse Practitioners) who all work together to provide you with the care you need, when you need it.  We recommend signing up for the patient portal called "MyChart".  Sign up information is provided on this After Visit Summary.  MyChart is used to connect with patients for Virtual Visits (Telemedicine).  Patients are able to view lab/test results, encounter notes, upcoming appointments, etc.  Non-urgent messages can be sent to your provider as well.   To learn more about what you can do with MyChart, go to [email or web address].    Your next appointment:   Wednesday, 08/04/20 at 11:45 AM  The format for your next appointment:   Virtual Visit   Provider:   Corine Shelter, PA-C

## 2020-07-23 NOTE — Assessment & Plan Note (Signed)
CHF on admission 07/10/2020

## 2020-07-23 NOTE — Assessment & Plan Note (Signed)
EF 30-35% by echo 07/11/2020

## 2020-07-23 NOTE — Assessment & Plan Note (Signed)
RCA PCI with DES 07/10/2020 -(single vessel CAD)

## 2020-07-23 NOTE — Assessment & Plan Note (Signed)
And NSVT post MI.  Unable to tolerate BB (low B/P and SB).  Discharged with Life Vest Add BB today

## 2020-08-04 ENCOUNTER — Ambulatory Visit: Payer: Medicare HMO | Admitting: Physician Assistant

## 2020-08-04 ENCOUNTER — Encounter: Payer: Self-pay | Admitting: Physician Assistant

## 2020-08-04 ENCOUNTER — Telehealth: Payer: Medicare HMO | Admitting: Cardiology

## 2020-08-04 ENCOUNTER — Other Ambulatory Visit: Payer: Self-pay

## 2020-08-04 VITALS — BP 132/62 | HR 57 | Ht 72.0 in | Wt 202.8 lb

## 2020-08-04 DIAGNOSIS — I5022 Chronic systolic (congestive) heart failure: Secondary | ICD-10-CM

## 2020-08-04 DIAGNOSIS — I255 Ischemic cardiomyopathy: Secondary | ICD-10-CM | POA: Diagnosis not present

## 2020-08-04 DIAGNOSIS — E785 Hyperlipidemia, unspecified: Secondary | ICD-10-CM

## 2020-08-04 DIAGNOSIS — R001 Bradycardia, unspecified: Secondary | ICD-10-CM

## 2020-08-04 DIAGNOSIS — I214 Non-ST elevation (NSTEMI) myocardial infarction: Secondary | ICD-10-CM

## 2020-08-04 MED ORDER — ENTRESTO 24-26 MG PO TABS: 1.0000 | ORAL_TABLET | Freq: Two times a day (BID) | ORAL | 3 refills | Status: DC

## 2020-08-04 NOTE — Progress Notes (Addendum)
Cardiology Office Note   Date:  08/04/2020   ID:  Cory Larson, DOB [birth year], MRN [id number]  PCP:  [name], MD Cardiologist:  [name], MD 07/10/2020 Electrphysiologist: None Theodore Demark, PA-C    History of Present Illness: Cory Larson is a 69 y.o. male with a history of STEMI 07/10/2020 s/p DES RCA, DM, HTN, HLD, S-CHF  Cory Larson presents for cardiology follow up.  He has not had any chest pain.   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   He is walking 3 miles/day, but stops frequently. No sx from this.   He feels he is tolerating the Coreg well. His HR is low at baseline, runs in the mid-50s.  No LE edema, no orthopnea or PND.  No palpitations.  No presyncope or syncope.   He does not read very well, but is able to describe to describe his medications, but says there is nothing he is supposed to take twice a day.  COVID status: vaccinated, did not have COVID Past Medical History:  Diagnosis Date  . Diabetes mellitus without complication (HCC)   . High cholesterol   . Hyperlipidemia   . Hypertension   . STEMI (ST elevation myocardial infarction) (HCC)   . Systolic heart failure [facility])     Past Surgical History:  Procedure Laterality Date  . CORONARY STENT INTERVENTION N/A 07/10/2020   Procedure: CORONARY STENT INTERVENTION;  Surgeon: Runell Gess, MD;  [facility];  Service: Cardiovascular;  Laterality: N/A;  . CORONARY THROMBECTOMY N/A 07/10/2020   Procedure: Coronary Thrombectomy;  Surgeon: Runell Gess, MD;  [facility]: [facility];  Service: Cardiovascular;  Laterality: N/A;  . CORONARY/GRAFT ACUTE MI REVASCULARIZATION N/A 07/10/2020   Procedure: Coronary/Graft Acute MI Revascularization;  Surgeon: Runell Gess, MD;  [facility]: [facility];  Service: Cardiovascular;  Laterality: N/A;  . LEFT HEART CATH AND CORONARY ANGIOGRAPHY N/A 07/10/2020   Procedure: LEFT  HEART CATH AND CORONARY ANGIOGRAPHY;  Surgeon: Runell Gess, MD;  [facility]: [facility];  Service: Cardiovascular;  Laterality: N/A;    Current Outpatient Medications  Medication Sig Dispense Refill  . aspirin 81 MG chewable tablet Chew 1 tablet (81 mg total) by mouth daily. 90 tablet 1  . carvedilol (COREG) 3.125 MG tablet Take 1 tablet (3.125 mg total) by mouth 2 (two) times daily. 60 tablet 3  . glipiZIDE (GLUCOTROL XL) 5 MG 24 hr tablet Take 5 mg by mouth daily.    . metFORMIN (GLUCOPHAGE) 500 MG tablet Take 500 mg by mouth daily.    . nitroGLYCERIN (NITROSTAT) 0.4 MG SL tablet Place 1 tablet (0.4 mg total) under the tongue every 5 (five) minutes x 3 doses as needed for chest pain. 25 tablet 2  . rosuvastatin (CRESTOR) 40 MG tablet Take 1 tablet (40 mg total) by mouth daily. 90 tablet 1  . ticagrelor (BRILINTA) 90 MG TABS tablet Take 1 tablet (90 mg total) by mouth 2 (two) times daily. 180 tablet 2   No current facility-administered medications for this visit.    Allergies:   Patient has no known allergies.    Social History:  The patient  reports that he has never smoked. He has never used smokeless tobacco.   Family History:  The patient's family history includes Heart disease in his paternal grandfather.  He indicated that the status of his paternal grandfather is unknown.  ROS:  Please see the history of present illness. All other systems are reviewed and negative.    PHYSICAL EXAM: VS:  BP 132/62   Pulse (!) 57   Ht 6' (1.829 m)   Wt 202 lb 12.8 oz (92 kg)   SpO2 99%   BMI 27.50 kg/m  , BMI Body mass index is 27.5 kg/m. GEN: Well nourished, well developed, male in no acute distress HEENT: normal for age  Neck: no JVD, no carotid bruit, no masses Cardiac: RRR; no murmur, no rubs, or gallops Respiratory:  clear to auscultation bilaterally, normal work of breathing GI: soft, nontender, nondistended, + BS MS: no deformity or atrophy; no edema; distal  pulses are 2+ in all 4 extremities  Skin: warm and dry, no rash Neuro:  Strength and sensation are intact Psych: euthymic mood, full affect   EKG:  EKG is not ordered today.   ECHO: 07/11/2020 1. LVEF 30-35% with akinesis in the RCA territory, no apical thrombus on Definity echocontrast images.  2. RVEF moderately reduced.  3. Left ventricular ejection fraction, by estimation, is 30 to 35%. The  left ventricle has moderately decreased function. The left ventricle has  no regional wall motion abnormalities. There is mild concentric left  ventricular hypertrophy. Left  ventricular diastolic parameters are consistent with Grade I diastolic  dysfunction (impaired relaxation).  4. Right ventricular systolic function is moderately reduced. The right  ventricular size is mildly enlarged.  5. The pericardial effusion is posterior to the left ventricle.  6. The mitral valve is normal in structure. Trivial mitral valve  regurgitation. No evidence of mitral stenosis.  7. The aortic valve is normal in structure. Aortic valve regurgitation is  not visualized. No aortic stenosis is present.  8. The inferior vena cava is normal in size with greater than 50%  respiratory variability, suggesting right atrial pressure of 3 mmHg.   CATH: 07/10/2020  Prox RCA lesion is 100% stenosed.  A drug-eluting stent was successfully placed using a SYNERGY XD 3.0X32.  Post intervention, there is a 0% residual stenosis.  There is moderate to severe left ventricular systolic dysfunction.  LV end diastolic pressure is mildly elevated.  The left ventricular ejection fraction is 35-45% by visual estimate.   Recent Labs: 07/10/2020: ALT 21 07/11/2020: BUN 14; Creatinine, Ser 0.87; Hemoglobin 12.6; Platelets 204; Potassium 4.1; Sodium 136  CBC    Component Value Date/Time   WBC 9.9 07/11/2020 0121   RBC 4.02 (L) 07/11/2020 0121   HGB 12.6 (L) 07/11/2020 0121   HCT 37.5 (L) 07/11/2020 0121   PLT  204 07/11/2020 0121   MCV 93.3 07/11/2020 0121   MCH 31.3 07/11/2020 0121   MCHC 33.6 07/11/2020 0121   RDW 12.7 07/11/2020 0121   LYMPHSABS 1.4 07/10/2020 1114   MONOABS 0.7 07/10/2020 1114   EOSABS 0.0 07/10/2020 1114   BASOSABS 0.0 07/10/2020 1114   CMP Latest Ref Rng & Units 07/11/2020 07/10/2020 07/10/2020  Glucose 70 - 99 mg/dL 818(E) - 993(Z)  BUN 8 - 23 mg/dL 14 - 12  Creatinine 1.69 - 1.24 mg/dL 6.78 9.38 1.01(B)  Sodium 135 - 145 mmol/L 136 - 139  Potassium 3.5 - 5.1 mmol/L 4.1 - 4.1  Chloride 98 - 111 mmol/L 106 - 105  CO2 22 - 32 mmol/L 23 - -  Calcium 8.9 - 10.3 mg/dL 5.1(W) - -  Total Protein 6.5 - 8.1 g/dL - - -  Total Bilirubin 0.3 - 1.2 mg/dL - - -  Alkaline Phos 38 - 126 U/L - - -  AST 15 - 41 U/L - - -  ALT 0 - 44 U/L - - -     Lipid Panel Lab Results  Component Value Date   CHOL 133 07/11/2020   HDL 54 07/11/2020   LDLCALC 63 07/11/2020   TRIG 79 07/11/2020   CHOLHDL 2.5 07/11/2020      Wt Readings from Last 3 Encounters:  08/04/20 202 lb 12.8 oz (92 kg)  07/23/20 199 lb 6.4 oz (90.4 kg)  07/12/20 204 lb 8 oz (92.8 kg)     Other studies Reviewed: Additional studies/ records that were reviewed today include: Office notes, hospital records and testing.  ASSESSMENT AND PLAN:  1.  NSTEMI -S/p DES RCA -Continue ASA, Brilinta, high-dose Crestor -Continue beta-blocker  2.  Bradycardia -He is tolerating low-dose carvedilol, continue this -Baseline heart rate is sinus bradycardia in the 50s, asymptomatic  3.  Chronic systolic CHF, ischemic cardiomyopathy: -He has both RV and LV dysfunction on his echo -His weight is up 3 pounds from discharge, but he has no significant volume overload on exam -He is not currently on a diuretic, does not need 1 -He was supposed to be started on Entresto 24-26 mg twice daily at this visit.  -Start Entresto 24-26 mg, emphasized the fact that he needs to take it twice daily.  -Recheck echocardiogram prior to next  office visit  4.  Dyslipidemia -He is currently on high-dose Crestor -Continue this and check lipids plus LFTs prior to his next office visit.   Current medicines are reviewed at length with the patient today.  The patient does not have concerns regarding medicines.  The following changes have been made: Add Entresto  Labs/ tests ordered today include:  No orders of the defined types were placed in this encounter.  Disposition:   FU with [name], MD  Signed, Theodore Demark, PA-C  08/04/2020 12:23 PM    [facility] Phone: [phone]; Fax: [phone]

## 2020-08-04 NOTE — Patient Instructions (Signed)
Medication Instructions:   START Entresto 24-26 2 times a day  *If you need a refill on your cardiac medications before your next appointment, please call your pharmacy*  Lab Work: Your physician recommends that you return for lab work in: 3 months prior to follow up appointment.   Fasting Lipid Panel-DO NOT EAT OR DRINK PAST MIDNIGHT. OKAY TO HAVE WATER  Hepatic (Liver) function Test  If you have labs (blood work) drawn today and your tests are completely normal, you will receive your results only by: Marland Kitchen MyChart Message (if you have MyChart) OR . A paper copy in the mail If you have any lab test that is abnormal or we need to change your treatment, we will call you to review the results.  Testing/Procedures: Your physician has requested that you have an echocardiogram. Echocardiography is a painless test that uses sound waves to create images of your heart. It provides your doctor with information about the size and shape of your heart and how well your heart's chambers and valves are working. This procedure takes approximately one hour. There are no restrictions for this procedure.   Please schedule for 3 months and prior to follow up with Dr. [name]   Follow-Up: At [facility], you and your health needs are our priority.  As part of our continuing mission to provide you with exceptional heart care, we have created designated Provider Care Teams.  These Care Teams include your primary Cardiologist (physician) and Advanced Practice Providers (APPs -  Physician Assistants and Nurse Practitioners) who all work together to provide you with the care you need, when you need it.  We recommend signing up for the patient portal called "MyChart".  Sign up information is provided on this After Visit Summary.  MyChart is used to connect with patients for Virtual Visits (Telemedicine).  Patients are able to view lab/test results, encounter notes, upcoming appointments, etc.  Non-urgent messages can  be sent to your provider as well.   To learn more about what you can do with MyChart, go to [email or web address].    Your next appointment:   3 month(s)  The format for your next appointment:   In Person  Provider:   [name], MD  Other Instructions

## 2020-08-11 DIAGNOSIS — I420 Dilated cardiomyopathy: Secondary | ICD-10-CM | POA: Diagnosis not present

## 2020-08-11 DIAGNOSIS — I2119 ST elevation (STEMI) myocardial infarction involving other coronary artery of inferior wall: Secondary | ICD-10-CM | POA: Diagnosis not present

## 2020-08-20 DIAGNOSIS — R69 Illness, unspecified: Secondary | ICD-10-CM | POA: Diagnosis not present

## 2020-09-02 ENCOUNTER — Telehealth (HOSPITAL_COMMUNITY): Payer: Self-pay

## 2020-09-02 ENCOUNTER — Encounter (HOSPITAL_COMMUNITY): Payer: Self-pay

## 2020-09-02 NOTE — Telephone Encounter (Signed)
Attempted to call patient in regards to Cardiac Rehab - LM on VM Mailed letter 

## 2020-09-11 DIAGNOSIS — I420 Dilated cardiomyopathy: Secondary | ICD-10-CM | POA: Diagnosis not present

## 2020-09-11 DIAGNOSIS — I2119 ST elevation (STEMI) myocardial infarction involving other coronary artery of inferior wall: Secondary | ICD-10-CM | POA: Diagnosis not present

## 2020-09-16 ENCOUNTER — Telehealth (HOSPITAL_COMMUNITY): Payer: Self-pay

## 2020-09-16 NOTE — Telephone Encounter (Signed)
No response from pt.  Closed referral  

## 2020-09-22 DIAGNOSIS — I5020 Unspecified systolic (congestive) heart failure: Secondary | ICD-10-CM | POA: Diagnosis not present

## 2020-09-22 DIAGNOSIS — E119 Type 2 diabetes mellitus without complications: Secondary | ICD-10-CM | POA: Diagnosis not present

## 2020-09-22 DIAGNOSIS — I252 Old myocardial infarction: Secondary | ICD-10-CM | POA: Diagnosis not present

## 2020-10-15 ENCOUNTER — Other Ambulatory Visit: Payer: Self-pay | Admitting: Cardiology

## 2020-11-04 ENCOUNTER — Other Ambulatory Visit: Payer: Self-pay

## 2020-11-04 ENCOUNTER — Ambulatory Visit (HOSPITAL_COMMUNITY): Payer: Medicare HMO | Attending: Internal Medicine

## 2020-11-04 DIAGNOSIS — I255 Ischemic cardiomyopathy: Secondary | ICD-10-CM | POA: Diagnosis not present

## 2020-11-04 LAB — ECHOCARDIOGRAM COMPLETE
Area-P 1/2: 3.37 cm2
S' Lateral: 5.3 cm

## 2020-11-04 MED ORDER — PERFLUTREN LIPID MICROSPHERE
1.0000 mL | INTRAVENOUS | Status: AC | PRN
  Administered 2020-11-04: 1 mL via INTRAVENOUS

## 2020-11-10 ENCOUNTER — Ambulatory Visit: Payer: Medicare HMO | Admitting: Cardiovascular Disease

## 2020-11-10 ENCOUNTER — Other Ambulatory Visit: Payer: Self-pay

## 2020-11-10 ENCOUNTER — Encounter: Payer: Self-pay | Admitting: Cardiovascular Disease

## 2020-11-10 VITALS — BP 144/76 | HR 55 | Ht 71.0 in | Wt 213.0 lb

## 2020-11-10 DIAGNOSIS — I252 Old myocardial infarction: Secondary | ICD-10-CM

## 2020-11-10 DIAGNOSIS — E785 Hyperlipidemia, unspecified: Secondary | ICD-10-CM | POA: Diagnosis not present

## 2020-11-10 DIAGNOSIS — I5022 Chronic systolic (congestive) heart failure: Secondary | ICD-10-CM | POA: Diagnosis not present

## 2020-11-10 DIAGNOSIS — R001 Bradycardia, unspecified: Secondary | ICD-10-CM

## 2020-11-10 DIAGNOSIS — I255 Ischemic cardiomyopathy: Secondary | ICD-10-CM

## 2020-11-10 MED ORDER — ENTRESTO 49-51 MG PO TABS: 1.0000 | ORAL_TABLET | Freq: Two times a day (BID) | ORAL | 3 refills | Status: DC

## 2020-11-10 NOTE — Assessment & Plan Note (Signed)
History of dyslipidemia on rosuvastatin 20 mg a day prior to admission.  His lipid profile at that time revealed total cholesterol of 133, LDL of 63 and HDL 54.  His rosuvastatin was subsequently increased up to 40 mg a day.

## 2020-11-10 NOTE — Assessment & Plan Note (Signed)
History of inferolateral STEMI 07/10/2020 with a 3 morning.  Cath him radially revealing occluded dominant RCA which I stented using a 3 mm x 32 mm long Synergy drug-eluting stent postdilated with a 3.25 mm balloon (3.33 mm) with an excellent angiographic result.  He had no other significant CAD.  His EF was in the 30 to 35% range.  He remains on aspirin and Brilinta.  He denies chest pain or shortness of breath.

## 2020-11-10 NOTE — Assessment & Plan Note (Signed)
History of ischemic cardiomyopathy with an EF initially of 30 to 35%.  He has been on carvedilol and Entresto for the last 3 months and a recent echo performed 11/04/2020 revealed continued LV dysfunction with an EF of 30 to 35%.  I am referring him to Dr. [name] for consideration of ICD implantation for primary prevention.  I am going to uptitrate his Entresto.

## 2020-11-10 NOTE — Assessment & Plan Note (Signed)
Beta-blockers held during hospitalization because of symptomatic bradycardia.  His heart rates run in the fifties.  He is an Academic librarian and a former runner.  He is on low-dose carvedilol 3.125 mg p.o. twice daily.  There is no room to uptitrate

## 2020-11-10 NOTE — Progress Notes (Signed)
11/10/2020 Cory Larson   [birth year]  [id number]  Primary Physician [name], MD Primary Cardiologist: Runell Gess MD Nicholes Calamity, [place]  HPI:  Cory Larson is a 70 y.o.   fit appearing single Caucasian male father of 2 children who works as a Engineer, structural but developed sudden onset chest pain at 8:30 this morning.  History factors include treated diabetes, hyperlipidemia and untreated hypertension.  He does not smoke.  Never had a heart attack or stroke.  He did dial 911 and was brought by EMS to [facility] emergency room where his EKG showed inferolateral ST segment elevation.  His pain somewhat improved with sublingual nitroglycerin.  He was hypertensive in the ER with a blood pressure of 170/110 although his pain had markedly improved.  He did have persistent ST segment elevation.  He was brought urgently to the Cath Lab for angiography and intervention.  He was discharged home 2 days later.  He is started on Entresto and low-dose carvedilol.  He remains on dual antiplatelet therapy.  He denies chest pain but has had a little fatigue on maximal exertion.  Recent 2D echo revealed an EF of 30 to 35% on 11/04/2020 consistent with continued LV dysfunction.    Current Meds  Medication Sig  . aspirin 81 MG chewable tablet Chew 1 tablet (81 mg total) by mouth daily.  . carvedilol (COREG) 3.125 MG tablet TAKE 1 TABLET (3.125 MG TOTAL) BY MOUTH 2 (TWO) TIMES DAILY.  Marland Kitchen glipiZIDE (GLUCOTROL XL) 5 MG 24 hr tablet Take 5 mg by mouth daily.  . metFORMIN (GLUCOPHAGE) 500 MG tablet Take 500 mg by mouth daily.  . nitroGLYCERIN (NITROSTAT) 0.4 MG SL tablet Place 1 tablet (0.4 mg total) under the tongue every 5 (five) minutes x 3 doses as needed for chest pain.  . rosuvastatin (CRESTOR) 40 MG tablet Take 1 tablet (40 mg total) by mouth daily.  . ticagrelor (BRILINTA) 90 MG TABS tablet Take 1 tablet (90 mg total) by mouth 2 (two) times daily.  . [DISCONTINUED]  sacubitril-valsartan (ENTRESTO) 24-26 MG Take 1 tablet by mouth 2 (two) times daily.     No Known Allergies  Social History   Socioeconomic History  . Marital status: Single    Spouse name: Not on file  . Number of children: Not on file  . Years of education: Not on file  . Highest education level: Not on file  Occupational History  . Not on file  Tobacco Use  . Smoking status: Never Smoker  . Smokeless tobacco: Never Used  Substance and Sexual Activity  . Alcohol use: Not on file  . Drug use: Not on file  . Sexual activity: Not on file  Other Topics Concern  . Not on file  Social History Narrative  . Not on file   Social Determinants of Health   Financial Resource Strain: Not on file  Food Insecurity: Not on file  Transportation Needs: Not on file  Physical Activity: Not on file  Stress: Not on file  Social Connections: Not on file  Intimate Partner Violence: Not on file     Review of Systems: General: negative for chills, fever, night sweats or weight changes.  Cardiovascular: negative for chest pain, dyspnea on exertion, edema, orthopnea, palpitations, paroxysmal nocturnal dyspnea or shortness of breath Dermatological: negative for rash Respiratory: negative for cough or wheezing Urologic: negative for hematuria Abdominal: negative for nausea, vomiting, diarrhea, bright red blood per rectum, melena,  or hematemesis Neurologic: negative for visual changes, syncope, or dizziness All other systems reviewed and are otherwise negative except as noted above.    Blood pressure (!) 144/76, pulse (!) 55, height 5\' 11"  (1.803 m), weight 213 lb (96.6 kg), SpO2 99 %.  General appearance: alert and no distress Neck: no adenopathy, no carotid bruit, no JVD, supple, symmetrical, trachea midline and thyroid not enlarged, symmetric, no tenderness/mass/nodules Lungs: clear to auscultation bilaterally Heart: regular rate and rhythm, S1, S2 normal, no murmur, click, rub or  gallop Extremities: extremities normal, atraumatic, no cyanosis or edema Pulses: 2+ and symmetric Skin: Skin color, texture, turgor normal. No rashes or lesions Neurologic: Alert and oriented X 3, normal strength and tone. Normal symmetric reflexes. Normal coordination and gait  EKG not performed today  ASSESSMENT AND PLAN:   History of ST elevation myocardial infarction (STEMI) History of inferolateral STEMI 07/10/2020 with a 3 morning.  Cath him radially revealing occluded dominant RCA which I stented using a 3 mm x 32 mm long Synergy drug-eluting stent postdilated with a 3.25 mm balloon (3.33 mm) with an excellent angiographic result.  He had no other significant CAD.  His EF was in the 30 to 35% range.  He remains on aspirin and Brilinta.  He denies chest pain or shortness of breath.  Symptomatic bradycardia Beta-blockers held during hospitalization because of symptomatic bradycardia.  His heart rates run in the fifties.  He is an 09/09/2020 and a former runner.  He is on low-dose carvedilol 3.125 mg p.o. twice daily.  There is no room to uptitrate  Dyslipidemia, goal LDL below 70 History of dyslipidemia on rosuvastatin 20 mg a day prior to admission.  His lipid profile at that time revealed total cholesterol of 133, LDL of 63 and HDL 54.  His rosuvastatin was subsequently increased up to 40 mg a day.  Acute systolic heart failure (HCC) History of ischemic cardiomyopathy with an EF initially of 30 to 35%.  He has been on carvedilol and Entresto for the last 3 months and a recent echo performed 11/04/2020 revealed continued LV dysfunction with an EF of 30 to 35%.  I am referring him to Dr. 01/02/2021 for consideration of ICD implantation for primary prevention.  I am going to uptitrate his Entresto.      [name] MD FACP,FACC,FAHA, [facility] 11/10/2020 9:32 AM

## 2020-11-10 NOTE — Patient Instructions (Addendum)
Medication Instructions:  Increase Entresto to 49-51mg .  *If you need a refill on your cardiac medications before your next appointment, please call your pharmacy*   Lab Work: Your physician recommends that you return for lab work in: 10 day BMET.   If you have labs (blood work) drawn today and your tests are completely normal, you will receive your results only by: Marland Kitchen MyChart Message (if you have MyChart) OR . A paper copy in the mail If you have any lab test that is abnormal or we need to change your treatment, we will call you to review the results.   Follow-Up: At [facility], you and your health needs are our priority.  As part of our continuing mission to provide you with exceptional heart care, we have created designated Provider Care Teams.  These Care Teams include your primary Cardiologist (physician) and Advanced Practice Providers (APPs -  Physician Assistants and Nurse Practitioners) who all work together to provide you with the care you need, when you need it.  We recommend signing up for the patient portal called "MyChart".  Sign up information is provided on this After Visit Summary.  MyChart is used to connect with patients for Virtual Visits (Telemedicine).  Patients are able to view lab/test results, encounter notes, upcoming appointments, etc.  Non-urgent messages can be sent to your provider as well.   To learn more about what you can do with MyChart, go to [email or web address].    Your next appointment:   3 month(s)  The format for your next appointment:   In Person  Provider:   [name], MD   Other Instructions Keep a 30 day blood pressure log and return to see a PharmD in 4 weeks.  (We recommend getting a [employer] blood pressure cuff.)   Referral placed to Dr. [name] for appointment to discuss defibrillator.

## 2020-11-18 ENCOUNTER — Other Ambulatory Visit: Payer: Self-pay

## 2020-11-18 ENCOUNTER — Encounter: Payer: Self-pay | Admitting: Cardiovascular Disease

## 2020-11-18 ENCOUNTER — Ambulatory Visit: Payer: Medicare HMO | Admitting: Cardiovascular Disease

## 2020-11-18 VITALS — BP 126/78 | HR 68 | Ht 71.0 in | Wt 210.0 lb

## 2020-11-18 DIAGNOSIS — E7800 Pure hypercholesterolemia, unspecified: Secondary | ICD-10-CM | POA: Diagnosis not present

## 2020-11-18 DIAGNOSIS — E119 Type 2 diabetes mellitus without complications: Secondary | ICD-10-CM

## 2020-11-18 DIAGNOSIS — I5022 Chronic systolic (congestive) heart failure: Secondary | ICD-10-CM

## 2020-11-18 DIAGNOSIS — Z9189 Other specified personal risk factors, not elsewhere classified: Secondary | ICD-10-CM

## 2020-11-18 DIAGNOSIS — I2510 Atherosclerotic heart disease of native coronary artery without angina pectoris: Secondary | ICD-10-CM | POA: Diagnosis not present

## 2020-11-18 NOTE — Patient Instructions (Signed)
Medication Instructions:  No changes *If you need a refill on your cardiac medications before your next appointment, please call your pharmacy*   Lab Work: None ordered If you have labs (blood work) drawn today and your tests are completely normal, you will receive your results only by: Marland Kitchen MyChart Message (if you have MyChart) OR . A paper copy in the mail If you have any lab test that is abnormal or we need to change your treatment, we will call you to review the results.   Testing/Procedures: None ordered   Follow-Up: At [facility], you and your health needs are our priority.  As part of our continuing mission to provide you with exceptional heart care, we have created designated Provider Care Teams.  These Care Teams include your primary Cardiologist (physician) and Advanced Practice Providers (APPs -  Physician Assistants and Nurse Practitioners) who all work together to provide you with the care you need, when you need it.  We recommend signing up for the patient portal called "MyChart".  Sign up information is provided on this After Visit Summary.  MyChart is used to connect with patients for Virtual Visits (Telemedicine).  Patients are able to view lab/test results, encounter notes, upcoming appointments, etc.  Non-urgent messages can be sent to your provider as well.   To learn more about what you can do with MyChart, go to [email or web address].    Your next appointment:   Call when you are ready to schedule the procedure for the defibrillator.

## 2020-11-19 LAB — BASIC METABOLIC PANEL
BUN/Creatinine Ratio: 19 (ref 10–24)
BUN: 21 mg/dL (ref 8–27)
CO2: 24 mmol/L (ref 20–29)
Calcium: 9.5 mg/dL (ref 8.6–10.2)
Chloride: 100 mmol/L (ref 96–106)
Creatinine, Ser: 1.09 mg/dL (ref 0.76–1.27)
GFR calc Af Amer: 81 mL/min/{1.73_m2} (ref >59)
GFR calc non Af Amer: 70 mL/min/{1.73_m2} (ref >59)
Glucose: 129 mg/dL — ABNORMAL HIGH (ref 65–99)
Potassium: 4.8 mmol/L (ref 3.5–5.2)
Sodium: 136 mmol/L (ref 134–144)

## 2020-11-20 NOTE — Progress Notes (Signed)
Cardiology Office Note:    Date:  11/20/2020   ID:  Cory Larson, DOB 20-Aug-1951, MRN [id number]  PCP:  [name], MD  [facility] Cardiologist:  [name], MD  [facility] Electrophysiologist:  None   Referring MD: Runell Gess, MD   Chief Complaint  Patient presents with  . New Patient (Initial Visit)  He is referred by Dr. [name] to discuss primary prevention ICD implantation  History of Present Illness:    Cory Larson is a 70 y.o. male with a hx of CAD, extensive inferolateral STEMI due to occlusion of the right coronary artery 07/20/2020 and moderate to severely depressed left ventricular systolic function (EF remains 62-83% after more than 90 days of guideline directed medical therapy with carvedilol and Entresto).  His heart failure is very well compensated.  Recently he was able to play pickle ball without much in the way of dyspnea.  He was prescribed a LifeVest, but sent it back due to the cost.  He has not had recurrent angina.  He is still on dual antiplatelet therapy.  He reports good glycemic control, his most recent hemoglobin A1c was 6.6%.  He is on maximum dose rosuvastatin, and his recent LDL cholesterol was 63.  He denies palpitations, dizziness or syncope.  He does not have orthopnea, PND or edema.  No neurological complaints and no intermittent claudication.  Past Medical History:  Diagnosis Date  . Diabetes mellitus without complication (HCC)   . High cholesterol   . Hyperlipidemia   . Hypertension   . STEMI (ST elevation myocardial infarction) (HCC)   . Systolic heart failure [facility])     Past Surgical History:  Procedure Laterality Date  . CORONARY STENT INTERVENTION N/A 07/10/2020   Procedure: CORONARY STENT INTERVENTION;  Surgeon: Runell Gess, MD;  Location: [facility] INVASIVE CV LAB;  Service: Cardiovascular;  Laterality: N/A;  . CORONARY THROMBECTOMY N/A 07/10/2020   Procedure: Coronary Thrombectomy;  Surgeon: Runell Gess,  MD;  Location: [facility] INVASIVE CV LAB;  Service: Cardiovascular;  Laterality: N/A;  . CORONARY/GRAFT ACUTE MI REVASCULARIZATION N/A 07/10/2020   Procedure: Coronary/Graft Acute MI Revascularization;  Surgeon: Runell Gess, MD;  Location: [facility] INVASIVE CV LAB;  Service: Cardiovascular;  Laterality: N/A;  . LEFT HEART CATH AND CORONARY ANGIOGRAPHY N/A 07/10/2020   Procedure: LEFT HEART CATH AND CORONARY ANGIOGRAPHY;  Surgeon: Runell Gess, MD;  Location: [facility] INVASIVE CV LAB;  Service: Cardiovascular;  Laterality: N/A;    Current Medications: Current Meds  Medication Sig  . aspirin 81 MG chewable tablet Chew 1 tablet (81 mg total) by mouth daily.  . carvedilol (COREG) 3.125 MG tablet TAKE 1 TABLET (3.125 MG TOTAL) BY MOUTH 2 (TWO) TIMES DAILY.  Marland Kitchen glipiZIDE (GLUCOTROL XL) 5 MG 24 hr tablet Take 5 mg by mouth daily.  . metFORMIN (GLUCOPHAGE) 500 MG tablet Take 500 mg by mouth daily.  . nitroGLYCERIN (NITROSTAT) 0.4 MG SL tablet Place 1 tablet (0.4 mg total) under the tongue every 5 (five) minutes x 3 doses as needed for chest pain.  . rosuvastatin (CRESTOR) 40 MG tablet Take 1 tablet (40 mg total) by mouth daily.  . sacubitril-valsartan (ENTRESTO) 49-51 MG Take 1 tablet by mouth 2 (two) times daily.  . ticagrelor (BRILINTA) 90 MG TABS tablet Take 1 tablet (90 mg total) by mouth 2 (two) times daily.     Allergies:   Patient has no known allergies.   Social History   Socioeconomic History  .  Marital status: Single    Spouse name: Not on file  . Number of children: Not on file  . Years of education: Not on file  . Highest education level: Not on file  Occupational History  . Not on file  Tobacco Use  . Smoking status: Never Smoker  . Smokeless tobacco: Never Used  Substance and Sexual Activity  . Alcohol use: Not on file  . Drug use: Not on file  . Sexual activity: Not on file  Other Topics Concern  . Not on file  Social History Narrative  . Not on file   Social Determinants of  Health   Financial Resource Strain: Not on file  Food Insecurity: Not on file  Transportation Needs: Not on file  Physical Activity: Not on file  Stress: Not on file  Social Connections: Not on file     Family History: The patient's family history includes Heart disease in his paternal grandfather.  ROS:   Please see the history of present illness.     All other systems reviewed and are negative.  EKGs/Labs/Other Studies Reviewed:    The following studies were reviewed today: Cardiac catheterization 07/10/2020 and echocardiograms from 07/11/2020 and 11/04/2020  EKG:  EKG is ot ordered today.  The ekg ordered today demonstrates normal sinus rhythm, delayed R wave progression V1-V4, T wave inversion in the inferior leads and lead V6.  QTc 427 ms  Recent Labs: 07/10/2020: ALT 21 07/11/2020: Hemoglobin 12.6; Platelets 204 11/18/2020: BUN 21; Creatinine, Ser 1.09; Potassium 4.8; Sodium 136  Recent Lipid Panel    Component Value Date/Time   CHOL 133 07/11/2020 0121   TRIG 79 07/11/2020 0121   HDL 54 07/11/2020 0121   CHOLHDL 2.5 07/11/2020 0121   VLDL 16 07/11/2020 0121   LDLCALC 63 07/11/2020 0121     Risk Assessment/Calculations:       Physical Exam:    VS:  BP 126/78 (BP Location: Left Arm, Patient Position: Sitting, Cuff Size: Normal)   Pulse 68   Ht 5\' 11"  (1.803 m)   Wt 210 lb (95.3 kg)   BMI 29.29 kg/m     Wt Readings from Last 3 Encounters:  11/18/20 210 lb (95.3 kg)  11/10/20 213 lb (96.6 kg)  08/04/20 202 lb 12.8 oz (92 kg)     GEN:  Well nourished, well developed in no acute distress HEENT: Normal NECK: No JVD; No carotid bruits LYMPHATICS: No lymphadenopathy CARDIAC: RRR, no murmurs, rubs, gallops RESPIRATORY:  Clear to auscultation without rales, wheezing or rhonchi  ABDOMEN: Soft, non-tender, non-distended MUSCULOSKELETAL:  No edema; No deformity  SKIN: Warm and dry NEUROLOGIC:  Alert and oriented x 3 PSYCHIATRIC:  Normal affect    ASSESSMENT:    1. At risk for sudden cardiac death   2. Coronary artery disease involving native coronary artery of native heart without angina pectoris   3. Chronic systolic CHF (congestive heart failure), NYHA class 2 (HCC)   4. Hypercholesterolemia   5. Non-insulin dependent type 2 diabetes mellitus (HCC)    PLAN:    In order of problems listed above:  1. Increased risk of sudden cardiac death: Discussed the mechanism of sudden death in patients with depressed left ventricular systolic function after myocardial infarction and the benefit of primary prevention ICD.  He fully meets the criteria for primary prevention ICD implantation (Prior myocardial infarction, left ventricular ejection fraction under 35%, heart failure NYHA class II, on comprehensive medical therapy).  We discussed the procedure to implant  ICD at length, the immediate risk of complications, long-term issues including unnecessary shocks and hardware malfunctions, remote follow-up, etc.  He wants to think about it because he is primarily concerned about the cost of the procedure.  I think that is perfectly fine, as long as he does not wait too long. 2. CAD: Currently angina free, roughly 4 months after presentation with inferolateral STEMI and placement of stents in the dominant right coronary artery 3. CHF: On appropriate guideline directed medical therapy with Entresto and carvedilol.  Euvolemic without loop diuretics.  Excellent functional status.  Estimated LVEF 30-35%. 4. HLP: All lipid parameters in target range on maximum dose rosuvastatin 5. DM: Well-controlled on metformin monotherapy.  Option to add SGLT2 inhibitor for LV dysfunction.        Medication Adjustments/Labs and Tests Ordered: Current medicines are reviewed at length with the patient today.  Concerns regarding medicines are outlined above.  Orders Placed This Encounter  Procedures  . EKG 12-Lead   No orders of the defined types were placed in  this encounter.   Patient Instructions  Medication Instructions:  No changes *If you need a refill on your cardiac medications before your next appointment, please call your pharmacy*   Lab Work: None ordered If you have labs (blood work) drawn today and your tests are completely normal, you will receive your results only by: Marland Kitchen MyChart Message (if you have MyChart) OR . A paper copy in the mail If you have any lab test that is abnormal or we need to change your treatment, we will call you to review the results.   Testing/Procedures: None ordered   Follow-Up: At [facility], you and your health needs are our priority.  As part of our continuing mission to provide you with exceptional heart care, we have created designated Provider Care Teams.  These Care Teams include your primary Cardiologist (physician) and Advanced Practice Providers (APPs -  Physician Assistants and Nurse Practitioners) who all work together to provide you with the care you need, when you need it.  We recommend signing up for the patient portal called "MyChart".  Sign up information is provided on this After Visit Summary.  MyChart is used to connect with patients for Virtual Visits (Telemedicine).  Patients are able to view lab/test results, encounter notes, upcoming appointments, etc.  Non-urgent messages can be sent to your provider as well.   To learn more about what you can do with MyChart, go to [email or web address].    Your next appointment:   Call when you are ready to schedule the procedure for the defibrillator.     Signed, Thurmon Fair, MD  11/20/2020 4:15 PM    [facility]

## 2020-12-08 ENCOUNTER — Other Ambulatory Visit: Payer: Self-pay

## 2020-12-08 ENCOUNTER — Ambulatory Visit (INDEPENDENT_AMBULATORY_CARE_PROVIDER_SITE_OTHER): Payer: Medicare HMO | Admitting: Pharmacist

## 2020-12-08 VITALS — BP 120/68 | HR 60 | Resp 16 | Ht 71.0 in | Wt 213.6 lb

## 2020-12-08 DIAGNOSIS — I255 Ischemic cardiomyopathy: Secondary | ICD-10-CM | POA: Diagnosis not present

## 2020-12-08 MED ORDER — SPIRONOLACTONE 25 MG PO TABS: 12.5000 mg | ORAL_TABLET | Freq: Every day | ORAL | 1 refills | Status: DC

## 2020-12-08 NOTE — Progress Notes (Signed)
Patient ID: SAKARI ALKHATIB                 DOB: 1951-10-24                      MRN: [id number]     HPI: TYMAR POLYAK is a 70 y.o. male referred by Dr. [name] to pharmacist clinic for HF medication titration. PMH includes DM, hyperlipidemia, hypertension, STEMI, and HFrEF. Patient already on Entresto 49/51mg  twice daily and carvedilol 3.125mg  twice daily. Heart rate normally in 50s and low 60s, noted by Dr [name]. Okay to keep low dose carvedilol, but no room for further beta-blocker titration. ICD was recommended by Dr. [name], but patient will like to wait a little bit longer before any implant or surgical recommendation.  Patient will benefit from adding spironolactone to his regimen.  Current HTN meds:  Entresto 49/51mg  twice daily  Carvedilol 3.125mg  twice daily  Intolerance: none  BP goal: <130/80  Family History: .family history includes Heart disease in his paternal grandfather.  Social History: denies tobacco use  Diet: tries to eat good, diabetic limitations are more (less carbohydrates), eats lots vegetables  Exercise:  hickes 4-5 miles, walks around 3 miles , still play pickle ball  Home BP readings:  No records provided; per patient recollection BP around 130/70, pulse 53   Wt Readings from Last 3 Encounters:  12/08/20 213 lb 9.6 oz (96.9 kg)  11/18/20 210 lb (95.3 kg)  11/10/20 213 lb (96.6 kg)   BP Readings from Last 3 Encounters:  12/08/20 120/68  11/18/20 126/78  11/10/20 (!) 144/76   Pulse Readings from Last 3 Encounters:  12/08/20 60  11/18/20 68  11/10/20 (!) 55    Past Medical History:  Diagnosis Date  . Diabetes mellitus without complication (HCC)   . High cholesterol   . Hyperlipidemia   . Hypertension   . STEMI (ST elevation myocardial infarction) (HCC)   . Systolic heart failure [facility])     Current Outpatient Medications on File Prior to Visit  Medication Sig Dispense Refill  . carvedilol (COREG) 3.125 MG tablet TAKE 1 TABLET (3.125 MG  TOTAL) BY MOUTH 2 (TWO) TIMES DAILY. 180 tablet 1  . glipiZIDE (GLUCOTROL XL) 5 MG 24 hr tablet Take 5 mg by mouth daily.    . metFORMIN (GLUCOPHAGE) 500 MG tablet Take 500 mg by mouth daily.    . nitroGLYCERIN (NITROSTAT) 0.4 MG SL tablet Place 1 tablet (0.4 mg total) under the tongue every 5 (five) minutes x 3 doses as needed for chest pain. 25 tablet 2  . rosuvastatin (CRESTOR) 40 MG tablet Take 1 tablet (40 mg total) by mouth daily. 90 tablet 1  . sacubitril-valsartan (ENTRESTO) 49-51 MG Take 1 tablet by mouth 2 (two) times daily. 180 tablet 3  . ticagrelor (BRILINTA) 90 MG TABS tablet Take 1 tablet (90 mg total) by mouth 2 (two) times daily. 180 tablet 2   No current facility-administered medications on file prior to visit.    No Known Allergies  Blood pressure 120/68, pulse 60, resp. rate 16, height 5\' 11"  (1.803 m), weight 213 lb 9.6 oz (96.9 kg), SpO2 99 %.  Ischemic cardiomyopathy Blood pressure remains appropriate for further medication titration. Plan to complete guideline recommended therapy for HFrEF. He is currently on Entresto and carvedilol. Will add spironolactone 12.5mg  daily, and repeat BMET in 2 weeks. Patient will like to discuss adding Fargixa/Jardiance with PCP before adding a medication that can influence  glucose management therapy. Plan to follow up in 3 weeks, and continue medication titration as possible.    Markey Deady Rodriguez-Guzman PharmD, BCPS, CPP [facility] [address] [place] [address] 12/14/2020 4:05 PM

## 2020-12-08 NOTE — Patient Instructions (Addendum)
Return for a  follow up appointment  3 weeks  Check your blood pressure at home daily (if able) and keep record of the readings.  Take your BP meds as follows: *START taking spironolactone 12.5mg  every day*  *Discuss initiating Jardiance/Farxiga with your primary care*  Next laboratory: repeat blood work in 2 weeks  Bring all of your meds, your BP cuff and your record of home blood pressures to your next appointment.  Exercise as you're able, try to walk approximately 30 minutes per day.  Keep salt intake to a minimum, especially watch canned and prepared boxed foods.  Eat more fresh fruits and vegetables and fewer canned items.  Avoid eating in fast food restaurants.    HOW TO TAKE YOUR BLOOD PRESSURE: . Rest 5 minutes before taking your blood pressure. .  Don't smoke or drink caffeinated beverages for at least 30 minutes before. . Take your blood pressure before (not after) you eat. . Sit comfortably with your back supported and both feet on the floor (don't cross your legs). . Elevate your arm to heart level on a table or a desk. . Use the proper sized cuff. It should fit smoothly and snugly around your bare upper arm. There should be enough room to slip a fingertip under the cuff. The bottom edge of the cuff should be 1 inch above the crease of the elbow. . Ideally, take 3 measurements at one sitting and record the average.

## 2020-12-11 ENCOUNTER — Other Ambulatory Visit: Payer: Self-pay | Admitting: Cardiology

## 2020-12-13 NOTE — Telephone Encounter (Signed)
This is Dr. Berry's pt 

## 2020-12-14 ENCOUNTER — Encounter: Payer: Self-pay | Admitting: Pharmacist

## 2020-12-14 NOTE — Assessment & Plan Note (Signed)
Blood pressure remains appropriate for further medication titration. Plan to complete guideline recommended therapy for HFrEF. He is currently on Entresto and carvedilol. Will add spironolactone 12.5mg  daily, and repeat BMET in 2 weeks. Patient will like to discuss adding Fargixa/Jardiance with PCP before adding a medication that can influence glucose management therapy. Plan to follow up in 3 weeks, and continue medication titration as possible.

## 2020-12-23 DIAGNOSIS — I255 Ischemic cardiomyopathy: Secondary | ICD-10-CM | POA: Diagnosis not present

## 2020-12-23 LAB — BASIC METABOLIC PANEL
BUN/Creatinine Ratio: 17 (ref 10–24)
BUN: 18 mg/dL (ref 8–27)
CO2: 23 mmol/L (ref 20–29)
Calcium: 9.1 mg/dL (ref 8.6–10.2)
Chloride: 101 mmol/L (ref 96–106)
Creatinine, Ser: 1.06 mg/dL (ref 0.76–1.27)
GFR calc Af Amer: 84 mL/min/{1.73_m2} (ref >59)
GFR calc non Af Amer: 72 mL/min/{1.73_m2} (ref >59)
Glucose: 127 mg/dL — ABNORMAL HIGH (ref 65–99)
Potassium: 4.6 mmol/L (ref 3.5–5.2)
Sodium: 135 mmol/L (ref 134–144)

## 2020-12-29 ENCOUNTER — Other Ambulatory Visit: Payer: Self-pay | Admitting: Family Medicine

## 2020-12-29 DIAGNOSIS — I5020 Unspecified systolic (congestive) heart failure: Secondary | ICD-10-CM | POA: Diagnosis not present

## 2020-12-29 DIAGNOSIS — E7800 Pure hypercholesterolemia, unspecified: Secondary | ICD-10-CM | POA: Diagnosis not present

## 2020-12-29 DIAGNOSIS — H5702 Anisocoria: Secondary | ICD-10-CM | POA: Diagnosis not present

## 2020-12-29 DIAGNOSIS — M25512 Pain in left shoulder: Secondary | ICD-10-CM | POA: Diagnosis not present

## 2020-12-29 DIAGNOSIS — E119 Type 2 diabetes mellitus without complications: Secondary | ICD-10-CM | POA: Diagnosis not present

## 2020-12-29 DIAGNOSIS — Z125 Encounter for screening for malignant neoplasm of prostate: Secondary | ICD-10-CM | POA: Diagnosis not present

## 2020-12-29 DIAGNOSIS — Z0000 Encounter for general adult medical examination without abnormal findings: Secondary | ICD-10-CM | POA: Diagnosis not present

## 2020-12-29 DIAGNOSIS — I252 Old myocardial infarction: Secondary | ICD-10-CM | POA: Diagnosis not present

## 2020-12-30 ENCOUNTER — Ambulatory Visit (INDEPENDENT_AMBULATORY_CARE_PROVIDER_SITE_OTHER): Payer: Medicare HMO | Admitting: Pharmacist

## 2020-12-30 ENCOUNTER — Other Ambulatory Visit: Payer: Self-pay

## 2020-12-30 VITALS — BP 112/70 | HR 60 | Resp 17 | Ht 72.0 in | Wt 212.2 lb

## 2020-12-30 DIAGNOSIS — I255 Ischemic cardiomyopathy: Secondary | ICD-10-CM | POA: Diagnosis not present

## 2020-12-30 NOTE — Progress Notes (Signed)
Patient ID: Cory Larson                 DOB: 1951/02/17                      MRN: [id number]     HPI: Cory Larson is a 70 y.o. male referred by Dr. Allyson Larson to pharmacist clinic for HF medication titration. PMH includes DM, hyperlipidemia, hypertension, STEMI, and HFrEF. Patient already on Entresto 49/51mg  twice daily and carvedilol 3.125mg  twice daily. Heart rate normally in 50s and low 60s, noted by Dr Cory Larson. Okay to keep low dose carvedilol, but no room for further beta-blocker titration. ICD was recommended by Dr. Royann Larson, but patient will like to wait a little bit longer before any implant or surgical recommendation. During last OV we discussed adding SGLT2i to therapy, but patient wanted to discuss therapy with PCP prior to initiation. Now he is on Entresto, carvedilol, spironolactone and Jardiance.  He denies problems with current therapy, and reports compliance with all medication.  Current HTN meds:  Entresto 49/51mg  twice daily  Carvedilol 3.125mg  twice daily Spironolactone 12.5mg  daily Jardiance 10mg  daily (per PCP)  Intolerance: none  BP goal: <130/80  Family History: .family history includes Heart disease in his paternal grandfather.  Social History: denies tobacco use  Diet: tries to eat good, diabetic limitations are more (less carbohydrates), eats lots vegetables  Exercise:  hickes 4-5 miles, walks around 3 miles , still play pickle ball  Home BP readings:  23 readings, average 130/75, HR range 51-88 bpm  Wt Readings from Last 3 Encounters:  12/30/20 212 lb 3.2 oz (96.3 kg)  12/08/20 213 lb 9.6 oz (96.9 kg)  11/18/20 210 lb (95.3 kg)   BP Readings from Last 3 Encounters:  12/30/20 112/70  12/08/20 120/68  11/18/20 126/78   Pulse Readings from Last 3 Encounters:  12/30/20 60  12/08/20 60  11/18/20 68    Past Medical History:  Diagnosis Date  . Diabetes mellitus without complication (HCC)   . High cholesterol   . Hyperlipidemia   . Hypertension    . STEMI (ST elevation myocardial infarction) (HCC)   . Systolic heart failure [facility])     Current Outpatient Medications on File Prior to Visit  Medication Sig Dispense Refill  . carvedilol (COREG) 3.125 MG tablet TAKE 1 TABLET (3.125 MG TOTAL) BY MOUTH 2 (TWO) TIMES DAILY. 180 tablet 1  . [facility] ASPIRIN ADULT LOW DOSE 81 MG chewable tablet CHEW AND SWALLOW 1 TABLET BY MOUTH EVERY DAY 108 tablet 0  . empagliflozin (JARDIANCE) 10 MG TABS tablet Take 10 mg by mouth daily.    [facility] glipiZIDE (GLUCOTROL XL) 5 MG 24 hr tablet Take 5 mg by mouth daily.    . metFORMIN (GLUCOPHAGE) 500 MG tablet Take 500 mg by mouth daily.    . nitroGLYCERIN (NITROSTAT) 0.4 MG SL tablet Place 1 tablet (0.4 mg total) under the tongue every 5 (five) minutes x 3 doses as needed for chest pain. 25 tablet 2  . rosuvastatin (CRESTOR) 40 MG tablet Take 1 tablet (40 mg total) by mouth daily. 90 tablet 1  . sacubitril-valsartan (ENTRESTO) 49-51 MG Take 1 tablet by mouth 2 (two) times daily. 180 tablet 3  . spironolactone (ALDACTONE) 25 MG tablet Take 0.5 tablets (12.5 mg total) by mouth daily. 45 tablet 1  . ticagrelor (BRILINTA) 90 MG TABS tablet Take 1 tablet (90 mg total) by mouth 2 (two) times daily. 180 tablet 2  No current facility-administered medications on file prior to visit.    No Known Allergies  Blood pressure 112/70, pulse 60, resp. rate 17, height 6' (1.829 m), weight 212 lb 3.2 oz (96.3 kg), SpO2 99 %.  Ischemic cardiomyopathy Blood pressure and HR at goal today. Patient on guideline recommended therapy with Entresto, carvedilol, spironolactone, and Jardiance.   Will continue current therapy at this time. May be able to further titrate Entresto, but need 2-3 weeks on SGLT2i therapy prior to further titration. Patient is to keep appointment with DR Cory Larson as scheduled and contact pharmacist clinic if questions prior to next OV.   Cory Larson PharmD, BCPS, CPP [facility] [address] [place] [address] 01/07/2021 11:27 AM

## 2020-12-30 NOTE — Patient Instructions (Addendum)
Return for a  follow up appointment in 5 weeks with Dr [name]   Check your blood pressure at home daily (if able) and keep record of the readings.  Take your BP meds as follows: * ADD Jardiance 10mg  per PCP* * NO OTHER CHANGES*  Bring all of your meds, your BP cuff and your record of home blood pressures to your next appointment.  Exercise as you're able, try to walk approximately 30 minutes per day.  Keep salt intake to a minimum, especially watch canned and prepared boxed foods.  Eat more fresh fruits and vegetables and fewer canned items.  Avoid eating in fast food restaurants.    HOW TO TAKE YOUR BLOOD PRESSURE: . Rest 5 minutes before taking your blood pressure. .  Don't smoke or drink caffeinated beverages for at least 30 minutes before. . Take your blood pressure before (not after) you eat. . Sit comfortably with your back supported and both feet on the floor (don't cross your legs). . Elevate your arm to heart level on a table or a desk. . Use the proper sized cuff. It should fit smoothly and snugly around your bare upper arm. There should be enough room to slip a fingertip under the cuff. The bottom edge of the cuff should be 1 inch above the crease of the elbow. . Ideally, take 3 measurements at one sitting and record the average.

## 2021-01-04 DIAGNOSIS — H5702 Anisocoria: Secondary | ICD-10-CM | POA: Diagnosis not present

## 2021-01-04 DIAGNOSIS — H25813 Combined forms of age-related cataract, bilateral: Secondary | ICD-10-CM | POA: Diagnosis not present

## 2021-01-07 ENCOUNTER — Encounter: Payer: Self-pay | Admitting: Pharmacist

## 2021-01-07 NOTE — Assessment & Plan Note (Signed)
Blood pressure and HR at goal today. Patient on guideline recommended therapy with Entresto, carvedilol, spironolactone, and Jardiance.   Will continue current therapy at this time. May be able to further titrate Entresto, but need 2-3 weeks on SGLT2i therapy prior to further titration. Patient is to keep appointment with DR [name] as scheduled and contact pharmacist clinic if questions prior to next OV.

## 2021-01-10 ENCOUNTER — Ambulatory Visit
Admission: RE | Admit: 2021-01-10 | Discharge: 2021-01-10 | Disposition: A | Discharge status: Home / Self Care | Payer: Medicare HMO | Source: Ambulatory Visit | Attending: Family Medicine | Admitting: Family Medicine

## 2021-01-10 DIAGNOSIS — I672 Cerebral atherosclerosis: Secondary | ICD-10-CM | POA: Diagnosis not present

## 2021-01-10 DIAGNOSIS — H5702 Anisocoria: Secondary | ICD-10-CM | POA: Diagnosis not present

## 2021-01-10 DIAGNOSIS — G319 Degenerative disease of nervous system, unspecified: Secondary | ICD-10-CM | POA: Diagnosis not present

## 2021-01-14 DIAGNOSIS — M7502 Adhesive capsulitis of left shoulder: Secondary | ICD-10-CM | POA: Diagnosis not present

## 2021-01-28 DIAGNOSIS — M7502 Adhesive capsulitis of left shoulder: Secondary | ICD-10-CM | POA: Diagnosis not present

## 2021-02-02 DIAGNOSIS — M25512 Pain in left shoulder: Secondary | ICD-10-CM | POA: Diagnosis not present

## 2021-02-04 DIAGNOSIS — M7502 Adhesive capsulitis of left shoulder: Secondary | ICD-10-CM | POA: Diagnosis not present

## 2021-02-16 ENCOUNTER — Encounter: Payer: Self-pay | Admitting: Cardiovascular Disease

## 2021-02-16 ENCOUNTER — Other Ambulatory Visit: Payer: Self-pay

## 2021-02-16 ENCOUNTER — Ambulatory Visit: Payer: Medicare HMO | Admitting: Cardiovascular Disease

## 2021-02-16 VITALS — BP 118/68 | HR 56 | Ht 72.0 in | Wt 204.2 lb

## 2021-02-16 DIAGNOSIS — M7502 Adhesive capsulitis of left shoulder: Secondary | ICD-10-CM | POA: Diagnosis not present

## 2021-02-16 DIAGNOSIS — I252 Old myocardial infarction: Secondary | ICD-10-CM | POA: Diagnosis not present

## 2021-02-16 DIAGNOSIS — I255 Ischemic cardiomyopathy: Secondary | ICD-10-CM

## 2021-02-16 NOTE — Patient Instructions (Signed)
Medication Instructions:  Your physician recommends that you continue on your current medications as directed. Please refer to the Current Medication list given to you today.  *If you need a refill on your cardiac medications before your next appointment, please call your pharmacy*   Testing/Procedures: Your physician has requested that you have an echocardiogram. Echocardiography is a painless test that uses sound waves to create images of your heart. It provides your doctor with information about the size and shape of your heart and how well your heart's chambers and valves are working. This procedure takes approximately one hour. There are no restrictions for this procedure. This procedure is done at [address] Floor   Follow-Up: At [facility], you and your health needs are our priority.  As part of our continuing mission to provide you with exceptional heart care, we have created designated Provider Care Teams.  These Care Teams include your primary Cardiologist (physician) and Advanced Practice Providers (APPs -  Physician Assistants and Nurse Practitioners) who all work together to provide you with the care you need, when you need it.  We recommend signing up for the patient portal called "MyChart".  Sign up information is provided on this After Visit Summary.  MyChart is used to connect with patients for Virtual Visits (Telemedicine).  Patients are able to view lab/test results, encounter notes, upcoming appointments, etc.  Non-urgent messages can be sent to your provider as well.   To learn more about what you can do with MyChart, go to [email or web address].    Your next appointment:   6 month(s)  The format for your next appointment:   In Person  Provider:   Jonathan Berry, MD 

## 2021-02-16 NOTE — Assessment & Plan Note (Signed)
History of dyslipidemia on high-dose rosuvastatin with lipid profile performed on 07/11/2020 revealing a total cholesterol 133, LDL of 63 and HDL of 54.

## 2021-02-16 NOTE — Progress Notes (Signed)
02/16/2021 Cory Larson   03/10/1951  [id number]  Primary Physician [name], MD Primary Cardiologist: Runell Gess MD Nicholes Calam[place], [place]  HPI:  Cory Larson is a 69 y.o.  fit appearing single Caucasian male father of 2 children who works as a Engineer, structural but developed sudden onset chest pain at 830 in the morning.  Cardiac risk factors include treated diabetes, hyperlipidemia and untreated hypertension. He does not smoke. Never had a heart attack or stroke. He did dial 911 and was brought by EMS to [facility] emergency room where his EKG showed inferolateral ST segment elevation. His pain somewhat improved with sublingual nitroglycerin. He was hypertensive in the ER with a blood pressure of 170/110 although his pain had markedly improved. He did have persistent ST segment elevation. He was brought urgently to the Cath Lab for angiography and intervention.  He was discharged home 2 days later.  He is started on Entresto and low-dose carvedilol.  He remains on dual antiplatelet therapy.  He denies chest pain but has had a little fatigue on maximal exertion.  Recent 2D echo revealed an EF of 30 to 35% on 11/04/2020 consistent with continued LV dysfunction.  I did refer him to Dr. [name] for consideration of ICD implantation for primary prevention however the patient was hesitant to pursue this.  He has been very active and is completely asymptomatic.    Current Meds  Medication Sig  . carvedilol (COREG) 3.125 MG tablet TAKE 1 TABLET (3.125 MG TOTAL) BY MOUTH 2 (TWO) TIMES DAILY.  . [facility] ASPIRIN ADULT LOW DOSE 81 MG chewable tablet CHEW AND SWALLOW 1 TABLET BY MOUTH EVERY DAY  . Cyanocobalamin 1000 MCG/ML LIQD Take by mouth.  [facility] glipiZIDE (GLUCOTROL XL) 5 MG 24 hr tablet Take 5 mg by mouth daily.  . metFORMIN (GLUCOPHAGE) 500 MG tablet Take 500 mg by mouth daily.  . nitroGLYCERIN (NITROSTAT) 0.4 MG SL tablet Place 1 tablet (0.4 mg total) under the  tongue every 5 (five) minutes x 3 doses as needed for chest pain.  . rosuvastatin (CRESTOR) 40 MG tablet Take 1 tablet (40 mg total) by mouth daily.  . sacubitril-valsartan (ENTRESTO) 49-51 MG Take 1 tablet by mouth 2 (two) times daily.  [facility] spironolactone (ALDACTONE) 25 MG tablet Take 0.5 tablets (12.5 mg total) by mouth daily.  . ticagrelor (BRILINTA) 90 MG TABS tablet Take 1 tablet (90 mg total) by mouth 2 (two) times daily.  . traMADol (ULTRAM) 50 MG tablet tramadol 50 mg tablet  TAKE 1 TABLET BY MOUTH EVERY 6 HOURS     No Known Allergies  Social History   Socioeconomic History  . Marital status: Single    Spouse name: Not on file  . Number of children: Not on file  . Years of education: Not on file  . Highest education level: Not on file  Occupational History  . Not on file  Tobacco Use  . Smoking status: Never Smoker  . Smokeless tobacco: Never Used  Substance and Sexual Activity  . Alcohol use: Not on file  . Drug use: Not on file  . Sexual activity: Not on file  Other Topics Concern  . Not on file  Social History Narrative  . Not on file   Social Determinants of Health   Financial Resource Strain: Not on file  Food Insecurity: Not on file  Transportation Needs: Not on file  Physical Activity: Not on file  Stress: Not on  file  Social Connections: Not on file  Intimate Partner Violence: Not on file     Review of Systems: General: negative for chills, fever, night sweats or weight changes.  Cardiovascular: negative for chest pain, dyspnea on exertion, edema, orthopnea, palpitations, paroxysmal nocturnal dyspnea or shortness of breath Dermatological: negative for rash Respiratory: negative for cough or wheezing Urologic: negative for hematuria Abdominal: negative for nausea, vomiting, diarrhea, bright red blood per rectum, melena, or hematemesis Neurologic: negative for visual changes, syncope, or dizziness All other systems reviewed and are otherwise negative  except as noted above.    Blood pressure 118/68, pulse (!) 56, height 6' (1.829 m), weight 204 lb 3.2 oz (92.6 kg), SpO2 98 %.  General appearance: alert and no distress Neck: no adenopathy, no carotid bruit, no JVD, supple, symmetrical, trachea midline and thyroid not enlarged, symmetric, no tenderness/mass/nodules Lungs: clear to auscultation bilaterally Heart: regular rate and rhythm, S1, S2 normal, no murmur, click, rub or gallop Extremities: extremities normal, atraumatic, no cyanosis or edema Pulses: 2+ and symmetric Skin: Skin color, texture, turgor normal. No rashes or lesions Neurologic: Alert and oriented X 3, normal strength and tone. Normal symmetric reflexes. Normal coordination and gait  EKG not performed today  ASSESSMENT AND PLAN:   History of ST elevation myocardial infarction (STEMI) History of inferior STEMI 07/10/2020 at 3:00 in the morning.  A catheter radially and opened up again occluded dominant RCA with a 3 mm x 32 mm long Synergy drug-eluting stent postdilated to 3.33 mm with an excellent angiographic result.  He remains on aspirin and Brilinta but denies chest pain or shortness of breath.  Dyslipidemia, goal LDL below 70 History of dyslipidemia on high-dose rosuvastatin with lipid profile performed on 07/11/2020 revealing a total cholesterol 133, LDL of 63 and HDL of 54.  Ischemic cardiomyopathy History of ischemic cardiomyopathy with an EF of 30 to 35% by 2D echo performed 11/04/2020 on guideline directed optimal medical therapy.  He has no evidence of heart failure.  I did send him to Dr. [name] to discussed ICD implantation for primary prevention however the patient was somewhat hesitant to pursue this.  We will recheck a 2D echocardiogram.      Runell Gess MD [facility], [facility] 02/16/2021 9:42 AM

## 2021-02-16 NOTE — Assessment & Plan Note (Signed)
History of inferior STEMI 07/10/2020 at 3:00 in the morning.  A catheter radially and opened up again occluded dominant RCA with a 3 mm x 32 mm long Synergy drug-eluting stent postdilated to 3.33 mm with an excellent angiographic result.  He remains on aspirin and Brilinta but denies chest pain or shortness of breath.

## 2021-02-16 NOTE — Assessment & Plan Note (Signed)
History of ischemic cardiomyopathy with an EF of 30 to 35% by 2D echo performed 11/04/2020 on guideline directed optimal medical therapy.  He has no evidence of heart failure.  I did send him to Dr. [name] to discussed ICD implantation for primary prevention however the patient was somewhat hesitant to pursue this.  We will recheck a 2D echocardiogram.

## 2021-02-17 DIAGNOSIS — I255 Ischemic cardiomyopathy: Secondary | ICD-10-CM | POA: Diagnosis not present

## 2021-02-17 DIAGNOSIS — E119 Type 2 diabetes mellitus without complications: Secondary | ICD-10-CM | POA: Diagnosis not present

## 2021-02-17 DIAGNOSIS — I5020 Unspecified systolic (congestive) heart failure: Secondary | ICD-10-CM | POA: Diagnosis not present

## 2021-02-17 DIAGNOSIS — H5702 Anisocoria: Secondary | ICD-10-CM | POA: Diagnosis not present

## 2021-02-18 DIAGNOSIS — M7502 Adhesive capsulitis of left shoulder: Secondary | ICD-10-CM | POA: Diagnosis not present

## 2021-02-22 DIAGNOSIS — M7502 Adhesive capsulitis of left shoulder: Secondary | ICD-10-CM | POA: Diagnosis not present

## 2021-02-24 DIAGNOSIS — M7502 Adhesive capsulitis of left shoulder: Secondary | ICD-10-CM | POA: Diagnosis not present

## 2021-02-25 DIAGNOSIS — M7502 Adhesive capsulitis of left shoulder: Secondary | ICD-10-CM | POA: Diagnosis not present

## 2021-03-14 ENCOUNTER — Ambulatory Visit (HOSPITAL_COMMUNITY): Payer: Medicare HMO | Attending: Cardiology

## 2021-03-14 ENCOUNTER — Other Ambulatory Visit: Payer: Self-pay

## 2021-03-14 DIAGNOSIS — I252 Old myocardial infarction: Secondary | ICD-10-CM | POA: Diagnosis not present

## 2021-03-14 DIAGNOSIS — I255 Ischemic cardiomyopathy: Secondary | ICD-10-CM

## 2021-03-14 LAB — ECHOCARDIOGRAM COMPLETE
Area-P 1/2: 2.99 cm2
S' Lateral: 4.1 cm

## 2021-03-14 MED ORDER — PERFLUTREN LIPID MICROSPHERE
3.0000 mL | INTRAVENOUS | Status: AC | PRN
  Administered 2021-03-14: 3 mL via INTRAVENOUS

## 2021-03-16 ENCOUNTER — Telehealth: Payer: Self-pay

## 2021-03-16 DIAGNOSIS — I255 Ischemic cardiomyopathy: Secondary | ICD-10-CM

## 2021-03-16 NOTE — Telephone Encounter (Addendum)
Left detailed message (ok per DPR) regarding Echo results and referral being placed to EP for ICD implantation. Pt instructed to call our office with questions.   ----- Message from Runell Gess, MD sent at 03/14/2021  1:11 PM EDT ----- EF unchanged . 30-35%. Please refer to Dr Lalla Brothers for ICD

## 2021-03-25 ENCOUNTER — Encounter: Payer: Self-pay | Admitting: Cardiology

## 2021-04-05 ENCOUNTER — Other Ambulatory Visit: Payer: Self-pay | Admitting: Cardiovascular Disease

## 2021-04-05 DIAGNOSIS — H25813 Combined forms of age-related cataract, bilateral: Secondary | ICD-10-CM | POA: Diagnosis not present

## 2021-04-05 DIAGNOSIS — H5702 Anisocoria: Secondary | ICD-10-CM | POA: Diagnosis not present

## 2021-04-05 DIAGNOSIS — H5203 Hypermetropia, bilateral: Secondary | ICD-10-CM | POA: Diagnosis not present

## 2021-04-05 DIAGNOSIS — E1136 Type 2 diabetes mellitus with diabetic cataract: Secondary | ICD-10-CM | POA: Diagnosis not present

## 2021-04-05 DIAGNOSIS — Z7984 Long term (current) use of oral hypoglycemic drugs: Secondary | ICD-10-CM | POA: Diagnosis not present

## 2021-04-05 DIAGNOSIS — H524 Presbyopia: Secondary | ICD-10-CM | POA: Diagnosis not present

## 2021-04-08 ENCOUNTER — Telehealth: Payer: Self-pay

## 2021-04-08 NOTE — Telephone Encounter (Signed)
Attempted to call pt regarding EP referral. Unable to leave voicemail, pt's mailbox is full.

## 2021-04-13 ENCOUNTER — Other Ambulatory Visit: Payer: Self-pay | Admitting: Cardiovascular Disease

## 2021-05-25 DIAGNOSIS — H25813 Combined forms of age-related cataract, bilateral: Secondary | ICD-10-CM | POA: Diagnosis not present

## 2021-06-07 DIAGNOSIS — J069 Acute upper respiratory infection, unspecified: Secondary | ICD-10-CM | POA: Diagnosis not present

## 2021-06-08 ENCOUNTER — Other Ambulatory Visit: Payer: Self-pay | Admitting: Cardiovascular Disease

## 2021-06-15 DIAGNOSIS — H2511 Age-related nuclear cataract, right eye: Secondary | ICD-10-CM | POA: Diagnosis not present

## 2021-06-15 DIAGNOSIS — H2513 Age-related nuclear cataract, bilateral: Secondary | ICD-10-CM | POA: Diagnosis not present

## 2021-06-15 DIAGNOSIS — H25012 Cortical age-related cataract, left eye: Secondary | ICD-10-CM | POA: Diagnosis not present

## 2021-06-22 DIAGNOSIS — H25012 Cortical age-related cataract, left eye: Secondary | ICD-10-CM | POA: Diagnosis not present

## 2021-06-22 DIAGNOSIS — H2512 Age-related nuclear cataract, left eye: Secondary | ICD-10-CM | POA: Diagnosis not present

## 2021-07-12 DIAGNOSIS — M25511 Pain in right shoulder: Secondary | ICD-10-CM | POA: Diagnosis not present

## 2021-07-14 DIAGNOSIS — M25511 Pain in right shoulder: Secondary | ICD-10-CM | POA: Diagnosis not present

## 2021-07-18 DIAGNOSIS — M25511 Pain in right shoulder: Secondary | ICD-10-CM | POA: Diagnosis not present

## 2021-07-21 DIAGNOSIS — M25511 Pain in right shoulder: Secondary | ICD-10-CM | POA: Diagnosis not present

## 2021-09-19 ENCOUNTER — Other Ambulatory Visit: Payer: Self-pay | Admitting: Cardiovascular Disease

## 2021-09-19 ENCOUNTER — Other Ambulatory Visit: Payer: Self-pay | Admitting: Cardiology

## 2021-12-03 ENCOUNTER — Other Ambulatory Visit: Payer: Self-pay | Admitting: Cardiovascular Disease

## 2022-01-03 DIAGNOSIS — Z0000 Encounter for general adult medical examination without abnormal findings: Secondary | ICD-10-CM | POA: Diagnosis not present

## 2022-01-03 DIAGNOSIS — I255 Ischemic cardiomyopathy: Secondary | ICD-10-CM | POA: Diagnosis not present

## 2022-01-03 DIAGNOSIS — E119 Type 2 diabetes mellitus without complications: Secondary | ICD-10-CM | POA: Diagnosis not present

## 2022-01-03 DIAGNOSIS — E7800 Pure hypercholesterolemia, unspecified: Secondary | ICD-10-CM | POA: Diagnosis not present

## 2022-01-03 DIAGNOSIS — Z125 Encounter for screening for malignant neoplasm of prostate: Secondary | ICD-10-CM | POA: Diagnosis not present

## 2022-01-03 DIAGNOSIS — I5020 Unspecified systolic (congestive) heart failure: Secondary | ICD-10-CM | POA: Diagnosis not present

## 2022-01-08 ENCOUNTER — Other Ambulatory Visit: Payer: Self-pay | Admitting: Cardiovascular Disease

## 2022-05-05 DIAGNOSIS — E119 Type 2 diabetes mellitus without complications: Secondary | ICD-10-CM | POA: Diagnosis not present

## 2022-05-05 DIAGNOSIS — E1129 Type 2 diabetes mellitus with other diabetic kidney complication: Secondary | ICD-10-CM | POA: Diagnosis not present

## 2022-05-05 DIAGNOSIS — R809 Proteinuria, unspecified: Secondary | ICD-10-CM | POA: Diagnosis not present

## 2022-05-05 DIAGNOSIS — N182 Chronic kidney disease, stage 2 (mild): Secondary | ICD-10-CM | POA: Diagnosis not present

## 2022-05-06 ENCOUNTER — Other Ambulatory Visit: Payer: Self-pay | Admitting: Cardiovascular Disease

## 2022-05-08 DIAGNOSIS — N179 Acute kidney failure, unspecified: Secondary | ICD-10-CM | POA: Diagnosis not present

## 2022-05-10 DIAGNOSIS — N179 Acute kidney failure, unspecified: Secondary | ICD-10-CM | POA: Diagnosis not present

## 2022-05-11 ENCOUNTER — Emergency Department (HOSPITAL_COMMUNITY): Payer: Medicare HMO

## 2022-05-11 ENCOUNTER — Encounter (HOSPITAL_COMMUNITY): Payer: Self-pay | Admitting: Emergency Medicine

## 2022-05-11 ENCOUNTER — Emergency Department (HOSPITAL_COMMUNITY)
Admission: EM | Admit: 2022-05-11 | Discharge: 2022-05-11 | Disposition: A | Discharge status: Home / Self Care | Payer: Medicare HMO | Attending: Emergency Medicine | Admitting: Emergency Medicine

## 2022-05-11 DIAGNOSIS — R001 Bradycardia, unspecified: Secondary | ICD-10-CM | POA: Diagnosis not present

## 2022-05-11 DIAGNOSIS — R61 Generalized hyperhidrosis: Secondary | ICD-10-CM | POA: Diagnosis not present

## 2022-05-11 DIAGNOSIS — N179 Acute kidney failure, unspecified: Secondary | ICD-10-CM | POA: Diagnosis not present

## 2022-05-11 DIAGNOSIS — I252 Old myocardial infarction: Secondary | ICD-10-CM | POA: Diagnosis not present

## 2022-05-11 DIAGNOSIS — R0789 Other chest pain: Secondary | ICD-10-CM | POA: Insufficient documentation

## 2022-05-11 DIAGNOSIS — Z7984 Long term (current) use of oral hypoglycemic drugs: Secondary | ICD-10-CM | POA: Diagnosis not present

## 2022-05-11 DIAGNOSIS — R799 Abnormal finding of blood chemistry, unspecified: Secondary | ICD-10-CM | POA: Insufficient documentation

## 2022-05-11 DIAGNOSIS — R899 Unspecified abnormal finding in specimens from other organs, systems and tissues: Secondary | ICD-10-CM

## 2022-05-11 DIAGNOSIS — N2889 Other specified disorders of kidney and ureter: Secondary | ICD-10-CM | POA: Diagnosis not present

## 2022-05-11 LAB — CBC WITH DIFFERENTIAL/PLATELET
Abs Immature Granulocytes: 0.03 10*3/uL (ref 0.00–0.07)
Basophils Absolute: 0 10*3/uL (ref 0.0–0.1)
Basophils Relative: 1 %
Eosinophils Absolute: 0 10*3/uL (ref 0.0–0.5)
Eosinophils Relative: 1 %
HCT: 35.3 % — ABNORMAL LOW (ref 39.0–52.0)
Hemoglobin: 11.5 g/dL — ABNORMAL LOW (ref 13.0–17.0)
Immature Granulocytes: 0 %
Lymphocytes Relative: 15 %
Lymphs Abs: 1.1 10*3/uL (ref 0.7–4.0)
MCH: 31 pg (ref 26.0–34.0)
MCHC: 32.6 g/dL (ref 30.0–36.0)
MCV: 95.1 fL (ref 80.0–100.0)
Monocytes Absolute: 0.7 10*3/uL (ref 0.1–1.0)
Monocytes Relative: 10 %
Neutro Abs: 5.3 10*3/uL (ref 1.7–7.7)
Neutrophils Relative %: 73 %
Platelets: 233 10*3/uL (ref 150–400)
RBC: 3.71 MIL/uL — ABNORMAL LOW (ref 4.22–5.81)
RDW: 12.2 % (ref 11.5–15.5)
WBC: 7.2 10*3/uL (ref 4.0–10.5)
nRBC: 0 % (ref 0.0–0.2)

## 2022-05-11 LAB — URINALYSIS, ROUTINE W REFLEX MICROSCOPIC
Bilirubin Urine: NEGATIVE
Glucose, UA: NEGATIVE mg/dL
Ketones, ur: NEGATIVE mg/dL
Leukocytes,Ua: NEGATIVE
Nitrite: NEGATIVE
Protein, ur: 100 mg/dL — AB
Specific Gravity, Urine: 1.011 (ref 1.005–1.030)
pH: 5 (ref 5.0–8.0)

## 2022-05-11 LAB — COMPREHENSIVE METABOLIC PANEL
ALT: 17 U/L (ref 0–44)
AST: 30 U/L (ref 15–41)
Albumin: 3.9 g/dL (ref 3.5–5.0)
Alkaline Phosphatase: 59 U/L (ref 38–126)
Anion gap: 15 (ref 5–15)
BUN: 17 mg/dL (ref 8–23)
CO2: 22 mmol/L (ref 22–32)
Calcium: 9.9 mg/dL (ref 8.9–10.3)
Chloride: 105 mmol/L (ref 98–111)
Creatinine, Ser: 1.95 mg/dL — ABNORMAL HIGH (ref 0.61–1.24)
GFR, Estimated: 37 mL/min — ABNORMAL LOW (ref >60)
Glucose, Bld: 141 mg/dL — ABNORMAL HIGH (ref 70–99)
Potassium: 4.3 mmol/L (ref 3.5–5.1)
Sodium: 142 mmol/L (ref 135–145)
Total Bilirubin: 0.7 mg/dL (ref 0.3–1.2)
Total Protein: 7.5 g/dL (ref 6.5–8.1)

## 2022-05-11 LAB — TROPONIN I (HIGH SENSITIVITY)
Troponin I (High Sensitivity): 8 ng/L (ref <18)
Troponin I (High Sensitivity): 10 ng/L (ref <18)

## 2022-05-11 NOTE — ED Provider Notes (Signed)
[facility] EMERGENCY DEPARTMENT Provider Note   CSN: [id number] Arrival date & time: 05/11/22  0701     History PMH: STEMI (2021), ischemic cardiomyopathy (EF 30-35%), HLD, DM2, CAD s/p stent Chief Complaint  Patient presents with   Abnormal Lab    Cory Larson is a 71 y.o. male. Patient presents to the ED with concern for abnormal labs and diaphoresis that he had this morning. On July seventh he was at his PCP for general screening exam without any active health complaints, and he was found to have a creatinine of 3.15.  He was told to stop his metformin, Entresto, and spironolactone.  He was also told to come to the emergency department, but patient declined this because he did not feel like waiting in the waiting room.  Repeat labs done on July 10 with a creatinine of 2.15.  He said he did not end up coming to the emergency department and till today because this morning he woke up and felt really hot and cold, clammy, and had filled the bed up with sweat.  He says this symptoms lasted for about 30 minutes at around 5 AM today.  He states the last time this happened, he ended up having a STEMI.  He never had the typical symptoms of a heart attack.  At this point, he feels completely back to normal. He denies Any shortness of breath, weakness, dizziness, chest pain, diarrhea, nausea, vomiting.   Abnormal Lab      Home Medications Prior to Admission medications   Medication Sig Start Date End Date Taking? Authorizing Provider  BRILINTA 90 MG TABS tablet TAKE 1 TABLET BY MOUTH TWICE A DAY 09/19/21   Runell Gess, MD  carvedilol (COREG) 3.125 MG tablet TAKE 1 TABLET BY MOUTH TWICE A DAY 05/08/22   Runell Gess, MD  [facility] ASPIRIN ADULT LOW DOSE 81 MG chewable tablet CHEW AND SWALLOW 1 TABLET BY MOUTH EVERY DAY 09/19/21   Runell Gess, MD  Cyanocobalamin 1000 MCG/ML LIQD Take by mouth.    [provider]  empagliflozin (JARDIANCE) 10 MG TABS  tablet Take 10 mg by mouth daily. Patient not taking: Reported on 02/16/2021    [provider]  ENTRESTO 49-51 MG TAKE 1 TABLET BY MOUTH TWICE A DAY 01/10/22   Runell Gess, MD  glipiZIDE (GLUCOTROL XL) 5 MG 24 hr tablet Take 5 mg by mouth daily. 03/28/20   [provider]  metFORMIN (GLUCOPHAGE) 500 MG tablet Take 500 mg by mouth daily. 03/30/20   [provider]  nitroGLYCERIN (NITROSTAT) 0.4 MG SL tablet Place 1 tablet (0.4 mg total) under the tongue every 5 (five) minutes x 3 doses as needed for chest pain. 07/12/20   Arty Baumgartner, NP  rosuvastatin (CRESTOR) 40 MG tablet Take 1 tablet (40 mg total) by mouth daily. 07/13/20   Arty Baumgartner, NP  spironolactone (ALDACTONE) 25 MG tablet TAKE 1/2 TABLET BY MOUTH EVERY DAY 12/05/21   Runell Gess, MD  traMADol (ULTRAM) 50 MG tablet tramadol 50 mg tablet  TAKE 1 TABLET BY MOUTH EVERY 6 HOURS 01/17/21   [provider]      Allergies    Patient has no known allergies.    Review of Systems   Review of Systems  Constitutional:  Positive for chills and diaphoresis. Negative for fever.  Respiratory:  Negative for chest tightness and shortness of breath.   Cardiovascular:  Negative for chest pain and  leg swelling.  Gastrointestinal:  Negative for abdominal pain, diarrhea, nausea and vomiting.  Genitourinary:  Negative for dysuria and hematuria.  Musculoskeletal:  Negative for back pain.  Skin:  Negative for pallor.  Neurological:  Negative for dizziness and weakness.  All other systems reviewed and are negative.   Physical Exam Updated Vital Signs BP (!) 158/86 (BP Location: Left Arm)   Pulse 61   Temp 98.3 F (36.8 C) (Oral)   Resp 16   SpO2 99%  Physical Exam Vitals and nursing note reviewed.  Constitutional:      General: He is not in acute distress.    Appearance: Normal appearance. He is not ill-appearing, toxic-appearing or diaphoretic.  HENT:     Head: Normocephalic and  atraumatic.     Nose: No nasal deformity.     Mouth/Throat:     Lips: Pink. No lesions.     Mouth: Mucous membranes are moist. No injury, lacerations, oral lesions or angioedema.     Pharynx: Oropharynx is clear. Uvula midline. No pharyngeal swelling, oropharyngeal exudate, posterior oropharyngeal erythema or uvula swelling.  Eyes:     General: Gaze aligned appropriately. No scleral icterus.       Right eye: No discharge.        Left eye: No discharge.     Conjunctiva/sclera: Conjunctivae normal.     Right eye: Right conjunctiva is not injected. No exudate or hemorrhage.    Left eye: Left conjunctiva is not injected. No exudate or hemorrhage.    Pupils: Pupils are equal, round, and reactive to light.  Cardiovascular:     Rate and Rhythm: Normal rate and regular rhythm.     Pulses: Normal pulses.          Radial pulses are 2+ on the right side and 2+ on the left side.       Dorsalis pedis pulses are 2+ on the right side and 2+ on the left side.     Heart sounds: Normal heart sounds, S1 normal and S2 normal. Heart sounds not distant. No murmur heard.    No friction rub. No gallop. No S3 or S4 sounds.  Pulmonary:     Effort: Pulmonary effort is normal. No accessory muscle usage or respiratory distress.     Breath sounds: Normal breath sounds. No stridor. No wheezing, rhonchi or rales.  Chest:     Chest wall: No tenderness.  Abdominal:     General: Abdomen is flat. There is no distension.     Palpations: Abdomen is soft. There is no mass or pulsatile mass.     Tenderness: There is no abdominal tenderness. There is no guarding or rebound.  Musculoskeletal:     Right lower leg: No edema.     Left lower leg: No edema.  Skin:    General: Skin is warm and dry.     Coloration: Skin is not jaundiced or pale.     Findings: No bruising, erythema, lesion or rash.  Neurological:     General: No focal deficit present.     Mental Status: He is alert and oriented to person, place, and time.      GCS: GCS eye subscore is 4. GCS verbal subscore is 5. GCS motor subscore is 6.  Psychiatric:        Mood and Affect: Mood normal.        Behavior: Behavior normal. Behavior is cooperative.     ED Results / Procedures / Treatments   Labs (all  labs ordered are listed, but only abnormal results are displayed) Labs Reviewed  COMPREHENSIVE METABOLIC PANEL - Abnormal; Notable for the following components:      Result Value   Glucose, Bld 141 (*)    Creatinine, Ser 1.95 (*)    GFR, Estimated 37 (*)    All other components within normal limits  CBC WITH DIFFERENTIAL/PLATELET - Abnormal; Notable for the following components:   RBC 3.71 (*)    Hemoglobin 11.5 (*)    HCT 35.3 (*)    All other components within normal limits  URINALYSIS, ROUTINE W REFLEX MICROSCOPIC - Abnormal; Notable for the following components:   APPearance CLOUDY (*)    Hgb urine dipstick MODERATE (*)    Protein, ur 100 (*)    Bacteria, UA FEW (*)    All other components within normal limits  TROPONIN I (HIGH SENSITIVITY)  TROPONIN I (HIGH SENSITIVITY)    EKG EKG Interpretation  Date/Time:  Thursday May 11 2022 14:45:35 EDT Ventricular Rate:  56 PR Interval:  174 QRS Duration: 110 QT Interval:  440 QTC Calculation: 424 R Axis:   33 Text Interpretation: Sinus bradycardia Nonspecific ST abnormality  Baseline artifact lead V3 When compared with ECG of 12-Jul-2020 06:43, PREVIOUS ECG IS PRESENT Confirmed by Alvester Chou [phone]) on 05/11/2022 3:06:14 PM  Radiology US RENAL  Result Date: 05/11/2022 CLINICAL DATA:  Renal dysfunction EXAM: RENAL / URINARY TRACT ULTRASOUND COMPLETE COMPARISON:  None Available. FINDINGS: Right Kidney: Renal measurements: 11.3 x 5.6 x 6.5 cm = volume: 214.5 mL. Echogenicity within normal limits. No mass or hydronephrosis visualized. Left Kidney: Renal measurements: 10.7 x 4.8 x 6 cm = volume: 161.2 mL. Echogenicity within normal limits. No mass or hydronephrosis visualized. Bladder:  Appears normal for degree of bladder distention. Other: None. IMPRESSION: No sonographic abnormalities are seen in the kidneys. There is no hydronephrosis. Electronically Signed   By: Ernie Avena M.D.   On: 05/11/2022 17:25   DG Chest 1 View  Result Date: 05/11/2022 CLINICAL DATA:  atypical chest pain EXAM: CHEST  1 VIEW COMPARISON:  None Available. FINDINGS: The cardiomediastinal silhouette is within normal limits. There is no focal airspace consolidation. There is no pleural effusion. There is no pneumothorax. No acute osseous abnormality. IMPRESSION: No evidence of acute cardiopulmonary disease. Electronically Signed   By: Caprice Renshaw M.D.   On: 05/11/2022 15:05    Procedures Procedures  This patient was on telemetry or cardiac monitoring during their time in the ED.    Medications Ordered in ED Medications - No data to display  ED Course/ Medical Decision Making/ A&P                           Medical Decision Making Amount and/or Complexity of Data Reviewed Labs: ordered. Radiology: ordered.    MDM  This is a 71 y.o. male who presents to the ED with abnormal lab and concern for diaphoresis The differential of this patient includes but is not limited to ACS, AKI, Heart Failure, etc.   Initial Impression  Well appearing. Vitals are stable. No acute distress I reviewed prior labs. About 6 days ago he had acutely worsening creatinine to 3.15. He discontinued Entresto, Meformin, and Spironolactone. His repeat creatinine three days ago is down to 2.15. No symptoms of hypervolemia or hypovolemia. He is also presenting with isolated moderate to severe diaphoresis, chills, and lightheadedness that occurred for about 30 minutes this morning at around 0500. He  notes that these symptoms are similar to prior STEMI as he did not have typical symptoms at that time. He is back to baseline now. Will add on cardiac workup to assess for this.   I personally ordered, reviewed, and  interpreted all laboratory work and imaging and agree with radiologist interpretation. Results interpreted below: CBC: hgb 11.5 (baseline) CMP: creat 1.95 (down from most recent of 2.15) Troponin 8 > 6 UA with few bacteria, moderate hgb Renal US normal CXR: No evidence of acute abnormality EKG: nonspecific ST abnormality, SB  Assessment/Plan:  Cardiac Workup was unremarkable. As patient is completely back to baseline, I do not think this is a cardiac event.  Creatinines are actually downtrending after discontinuing some possible insulting agents. He has follow up with nephrology and PCP. I do not think we need to do anything further as he is improving on own. We did get a renal US here which was normal. Recommend outpatient follow up  Patient has been reasonably screened and does not have any inpatient needs. He is stable for discharge.   Charting Requirements Additional history is obtained from:  Independent historian External Records from outside source obtained and reviewed including: PCP notes, prior labs Social Determinants of Health:  none Pertinant PMH that complicates patient's illness: Hx STEMI  Patient Care Problems that were addressed during this visit: - Diaphoresis: Acute illness with systemic symptoms - Abnormal Lab: Acute illness with complication This patient was maintained on a cardiac monitor/telemetry. I personally viewed and interpreted the cardiac monitor which reveals an underlying rhythm of NSR/SB Medications given in ED: n/a Reevaluation of the patient after these medicines showed that the patient improved I have reviewed home medications and made changes accordingly.  Critical Care Interventions: n/a Consultations: n/a Disposition: discharge  This is a supervised visit with my attending physician, Dr. Renaye Rakers. We have discussed this patient and they have altered the plan as needed.  Portions of this note were generated with Scientist, clinical (histocompatibility and immunogenetics).  Dictation errors may occur despite best attempts at proofreading.    Final Clinical Impression(s) / ED Diagnoses Final diagnoses:  Abnormal laboratory test  Diaphoresis    Rx / DC Orders ED Discharge Orders     None         Claudie Leach, PA-C 05/11/22 1844    Terald Sleeper, MD 05/12/22 [phone]

## 2022-05-11 NOTE — ED Notes (Signed)
Transported to [place].

## 2022-05-11 NOTE — ED Triage Notes (Signed)
Patient sent to ED for evaluation of a creatinine of 2.15 discovered on a routine lab draw on 7/10. Patient works as a Music therapist and had no complaints that prompted the testing. Patient now complains of chills that started this morning.

## 2022-05-11 NOTE — Discharge Instructions (Signed)
Your cardiac enzymes were normal today. It does not seem like you have had a cardiac event. You also had a renal ultrasound done that was normal. You can relay these results to your primary care doctor. You should follow up with Nephrology regarding your kidneys.

## 2022-05-29 DIAGNOSIS — N179 Acute kidney failure, unspecified: Secondary | ICD-10-CM | POA: Diagnosis not present

## 2022-06-01 DIAGNOSIS — N179 Acute kidney failure, unspecified: Secondary | ICD-10-CM | POA: Diagnosis not present

## 2022-06-01 DIAGNOSIS — I10 Essential (primary) hypertension: Secondary | ICD-10-CM | POA: Diagnosis not present

## 2022-06-04 ENCOUNTER — Other Ambulatory Visit: Payer: Self-pay | Admitting: Cardiovascular Disease

## 2022-06-11 ENCOUNTER — Other Ambulatory Visit: Payer: Self-pay | Admitting: Cardiovascular Disease

## 2022-06-16 ENCOUNTER — Encounter: Payer: Self-pay | Admitting: Physician Assistant

## 2022-06-16 ENCOUNTER — Ambulatory Visit (INDEPENDENT_AMBULATORY_CARE_PROVIDER_SITE_OTHER): Payer: Medicare HMO | Admitting: Physician Assistant

## 2022-06-16 VITALS — BP 124/60 | HR 50 | Ht 71.0 in | Wt 205.8 lb

## 2022-06-16 DIAGNOSIS — Z9861 Coronary angioplasty status: Secondary | ICD-10-CM

## 2022-06-16 DIAGNOSIS — N179 Acute kidney failure, unspecified: Secondary | ICD-10-CM | POA: Diagnosis not present

## 2022-06-16 DIAGNOSIS — I2510 Atherosclerotic heart disease of native coronary artery without angina pectoris: Secondary | ICD-10-CM

## 2022-06-16 DIAGNOSIS — E785 Hyperlipidemia, unspecified: Secondary | ICD-10-CM

## 2022-06-16 DIAGNOSIS — E119 Type 2 diabetes mellitus without complications: Secondary | ICD-10-CM | POA: Diagnosis not present

## 2022-06-16 DIAGNOSIS — I255 Ischemic cardiomyopathy: Secondary | ICD-10-CM

## 2022-06-16 DIAGNOSIS — I10 Essential (primary) hypertension: Secondary | ICD-10-CM | POA: Diagnosis not present

## 2022-06-16 MED ORDER — NITROGLYCERIN 0.4 MG SL SUBL: 0.4000 mg | SUBLINGUAL_TABLET | SUBLINGUAL | 2 refills | Status: DC | PRN

## 2022-06-16 NOTE — Patient Instructions (Addendum)
Medication Instructions:  RESTART Entresto 24 mg-26 mg 2 times a day for 2 weeks, if Systolic (top number) blood pressure is greater than 110 INCREASE Entresto to 49 mg-51 mg 2 times a day  *If you need a refill on your cardiac medications before your next appointment, please call your pharmacy*  Lab Work: Your physician recommends that you return for lab work in 2 weeks (06/30/22):  BMP  Your physician recommends that you return for lab work in 4 weeks (07/14/22):  BMP  If you have labs (blood work) drawn today and your tests are completely normal, you will receive your results only by: MyChart Message (if you have MyChart) OR A paper copy in the mail If you have any lab test that is abnormal or we need to change your treatment, we will call you to review the results.  Testing/Procedures: NONE ordered at this time of appointment   Follow-Up: At [facility], you and your health needs are our priority.  As part of our continuing mission to provide you with exceptional heart care, we have created designated Provider Care Teams.  These Care Teams include your primary Cardiologist (physician) and Advanced Practice Providers (APPs -  Physician Assistants and Nurse Practitioners) who all work together to provide you with the care you need, when you need it.   Your next appointment:   4-6 week(s)  The format for your next appointment:   In Person  Provider:   Azalee Course, PA-C       Other Instructions   Important Information About Sugar

## 2022-06-16 NOTE — Progress Notes (Unsigned)
Cardiology Office Note:    Date:  06/18/2022   ID:  Cory CRETELLA, DOB Jan 15, 1951, MRN [id number]  PCP:  Cory Found, MD   [facility] Providers Cardiologist:  Cory Batty, MD     Referring MD: Cory Found, MD   Chief Complaint  Patient presents with   Follow-up    Seen for Dr. Allyson Larson    History of Present Illness:    Cory Larson is a 71 y.o. male with a hx of hypertension, hyperlipidemia, DM2, CAD and ischemic cardiomyopathy.  He was previously admitted in September 2021 with inferolateral STEMI secondary to occlusion of the large dominant RCA, EF was 35 to 45% at the time with severe inferior and inferior apical wall motion abnormality.  This was treated with with Synergy XD 3.0 x 32 mm DES.  There was no other significant coronary artery disease.  Initial echocardiogram showed EF 30 to 35% with akinesis in the RCA territory, grade 1 DD.  He was started on Entresto, carvedilol and do antiplatelet therapy. Subsequent echocardiogram in January 2022 which continue to show EF 30 to 35%.  He was referred to Dr. Royann Larson for evaluation of ICD implantation, however patient was hesitant to pursue this.   Last echocardiogram in May 2022 showed EF is unchanged at 30 to 35%, grade 1 DD, no significant valve issue.  He was last seen by Dr. Allyson Larson on 02/16/2021 at which time he was doing well.  Most recently, patient was seen in the ED on 05/11/2022 with AKI, creatinine trended up to 2.15 when checked by PCP.  Spironolactone, Entresto and metformin were discontinued.  Repeat blood work in the emergency room shows creatinine has came down to 1.95.  Renal artery Doppler showed no sonographic abnormality seen in the kidneys, no hydronephrosis.  Patient presents today for follow-up.  He is feeling good without any chest pain or worsening dyspnea.  He was feeling poorly when he went to the emergency room, serial troponin was negative at that time.  EKG shows no significant  ischemic changes.  He is doing better now.  He has never started on the Jardiance in the first place.  He was taken off of metformin, Entresto and spironolactone.  Given his DM2 history, I would prefer to restart him on the [facility].  We will restart the patient on 24-26 mg twice a day of Entresto for 2 weeks then if systolic blood pressure is still greater than 110, he will restart the Entresto at a higher dose of 49-51 mg twice a day.  I gave him 2 weeks worth of 24-26 mg of Entresto sample.  For now I will hold off on restarting the spironolactone.  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  He suspect he was overheated and dehydrated that led to his AKI.  I will obtain basic metabolic panel in 2 weeks and again in 4 weeks to follow-up on the renal function.  I plan to see the patient back in 4 to 6 weeks.  We will decide in the future whether or not to restart on the spironolactone and whether to restart on the Jardiance again.  We will defer to PCP to decide when to restart on metformin.  Although if needs to look, I think the patient is stable enough to restart metformin now.  Past Medical History:  Diagnosis Date   Diabetes mellitus without complication (HCC)    High cholesterol  Hyperlipidemia    Hypertension    STEMI (ST elevation myocardial infarction) ([place])    Systolic heart failure ([place])     Past Surgical History:  Procedure Laterality Date   CORONARY STENT INTERVENTION N/A 07/10/2020   Procedure: CORONARY STENT INTERVENTION;  Surgeon: Lorretta Harp, MD;  Location: [facility];  Service: Cardiovascular;  Laterality: N/A;   CORONARY THROMBECTOMY N/A 07/10/2020   Procedure: Coronary Thrombectomy;  Surgeon: Lorretta Harp, MD;  Location: [facility];  Service: Cardiovascular;  Laterality: N/A;   CORONARY/GRAFT ACUTE MI REVASCULARIZATION N/A 07/10/2020   Procedure: Coronary/Graft Acute MI Revascularization;   Surgeon: Lorretta Harp, MD;  Location: [facility];  Service: Cardiovascular;  Laterality: N/A;   LEFT HEART CATH AND CORONARY ANGIOGRAPHY N/A 07/10/2020   Procedure: LEFT HEART CATH AND CORONARY ANGIOGRAPHY;  Surgeon: Lorretta Harp, MD;  Location: [facility];  Service: Cardiovascular;  Laterality: N/A;    Current Medications: Current Meds  Medication Sig   aspirin ([employer] ASPIRIN ADULT LOW DOSE) 81 MG chewable tablet CHEW AND SWALLOW 1 TABLET BY MOUTH EVERY DAY   BRILINTA 90 MG TABS tablet TAKE 1 TABLET BY MOUTH TWICE A DAY   carvedilol (COREG) 3.125 MG tablet TAKE 1 TABLET BY MOUTH TWICE A DAY   Cyanocobalamin 1000 MCG/ML LIQD Take by mouth daily.   glipiZIDE (GLUCOTROL XL) 5 MG 24 hr tablet Take 5 mg by mouth daily.   rosuvastatin (CRESTOR) 40 MG tablet Take 1 tablet (40 mg total) by mouth daily.   [DISCONTINUED] nitroGLYCERIN (NITROSTAT) 0.4 MG SL tablet Place 1 tablet (0.4 mg total) under the tongue every 5 (five) minutes x 3 doses as needed for chest pain.     Allergies:   Patient has no known allergies.   Social History   Socioeconomic History   Marital status: Single    Spouse name: Not on file   Number of children: Not on file   Years of education: Not on file   Highest education level: Not on file  Occupational History   Not on file  Tobacco Use   Smoking status: Never   Smokeless tobacco: Never  Substance and Sexual Activity   Alcohol use: Not on file   Drug use: Not on file   Sexual activity: Not on file  Other Topics Concern   Not on file  Social History Narrative   Not on file   Social Determinants of Health   Financial Resource Strain: Not on file  Food Insecurity: Not on file  Transportation Needs: Not on file  Physical Activity: Not on file  Stress: Not on file  Social Connections: Not on file     Family History: The patient's family history includes Heart disease in his paternal grandfather.  ROS:   Please see the history of  present illness.     All other systems reviewed and are negative.  EKGs/Labs/Other Studies Reviewed:    The following studies were reviewed today:  Echo 03/14/2021  1. Left ventricular ejection fraction, by estimation, is 30 to 35%. The  left ventricle has moderately decreased function. The left ventricle  demonstrates global hypokinesis. Left ventricular diastolic parameters are  consistent with Grade I diastolic  dysfunction (impaired relaxation).   2. Right ventricular systolic function is normal. The right ventricular  size is normal.   3. The mitral valve is normal in structure. No evidence of mitral valve  regurgitation. No evidence of mitral stenosis.   4.  The aortic valve is normal in structure. Aortic valve regurgitation is  not visualized. No aortic stenosis is present.   5. The inferior vena cava is normal in size with greater than 50%  respiratory variability, suggesting right atrial pressure of 3 mmHg.   Comparison(s): No significant change from prior study. Prior images  reviewed side by side.    EKG:  EKG is not ordered today.   Recent Labs: 05/11/2022: ALT 17; BUN 17; Creatinine, Ser 1.95; Hemoglobin 11.5; Platelets 233; Potassium 4.3; Sodium 142  Recent Lipid Panel    Component Value Date/Time   CHOL 133 07/11/2020 0121   TRIG 79 07/11/2020 0121   HDL 54 07/11/2020 0121   CHOLHDL 2.5 07/11/2020 0121   VLDL 16 07/11/2020 0121   LDLCALC 63 07/11/2020 0121     Risk Assessment/Calculations:           Physical Exam:    VS:  BP 124/60   Pulse (!) 50   Ht 5\' 11"  (1.803 m)   Wt 205 lb 12.8 oz (93.4 kg)   SpO2 96%   BMI 28.70 kg/m        Wt Readings from Last 3 Encounters:  06/16/22 205 lb 12.8 oz (93.4 kg)  02/16/21 204 lb 3.2 oz (92.6 kg)  12/30/20 212 lb 3.2 oz (96.3 kg)     GEN:  Well nourished, well developed in no acute distress HEENT: Normal NECK: No JVD; No carotid bruits LYMPHATICS: No lymphadenopathy CARDIAC: RRR, no murmurs, rubs,  gallops RESPIRATORY:  Clear to auscultation without rales, wheezing or rhonchi  ABDOMEN: Soft, non-tender, non-distended MUSCULOSKELETAL:  No edema; No deformity  SKIN: Warm and dry NEUROLOGIC:  Alert and oriented x 3 PSYCHIATRIC:  Normal affect   ASSESSMENT:    1. CAD S/P percutaneous coronary angioplasty   2. AKI (acute kidney injury) ([place])   3. Primary hypertension   4. Hyperlipidemia LDL goal <70   5. Controlled type 2 diabetes mellitus without complication, without long-term current use of insulin ([place])   6. Ischemic cardiomyopathy    PLAN:    In order of problems listed above:  CAD: Denies any recent chest pain.  Continue aspirin, Brilinta and Crestor  AKI: Renal function has returned back to the baseline.  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  We will hold off on restarting spironolactone, however I will restart Entresto today at 24-26 mg twice a day for 2 weeks, if systolic blood pressure is stable, plan to increase the dose after 2 weeks.  We will obtain basic metabolic panel in 2 weeks and again in 4 weeks.  Hypertension: Blood pressure stable despite he was taken off of both Entresto and spironolactone.  We will restart Entresto at a slow pace.  We will hold off restarting on spironolactone at this time.  Hyperlipidemia: On Crestor  DM2: Managed by primary care provider.  He probably can restart metformin if it is okay by PCP at this point  Ischemic cardiomyopathy: Due to recent dehydration and AKI, patient was taken off of metformin, spironolactone and Entresto, although Jardiance was supposedly also discontinued, however patient says he never started on the Jardiance prior to the recent hospitalization anyway.  Since his blood pressure is actually normal off of both moderate dose of Entresto and spironolactone, I decided to continue to hold spironolactone and restart Entresto at a  slow pace.  I have given him 2  weeks of samples of 24-26 mg twice a day of Entresto, after 2 weeks, if systolic blood pressure is still greater than 110, he can increase back up to the previous dose of 49-51 mg twice a day.  He will require basic metabolic panel in 2 weeks and again in 4 weeks to make sure renal function stay stable.           Medication Adjustments/Labs and Tests Ordered: Current medicines are reviewed at length with the patient today.  Concerns regarding medicines are outlined above.  Orders Placed This Encounter  Procedures   Basic metabolic panel   Basic metabolic panel   Meds ordered this encounter  Medications   nitroGLYCERIN (NITROSTAT) 0.4 MG SL tablet    Sig: Place 1 tablet (0.4 mg total) under the tongue every 5 (five) minutes x 3 doses as needed for chest pain.    Dispense:  25 tablet    Refill:  2    Patient Instructions  Medication Instructions:  RESTART Entresto 24 mg-26 mg 2 times a day for 2 weeks, if Systolic (top number) blood pressure is greater than 110 INCREASE Entresto to 49 mg-51 mg 2 times a day  *If you need a refill on your cardiac medications before your next appointment, please call your pharmacy*  Lab Work: Your physician recommends that you return for lab work in 2 weeks (06/30/22):  BMP  Your physician recommends that you return for lab work in 4 weeks (07/14/22):  BMP  If you have labs (blood work) drawn today and your tests are completely normal, you will receive your results only by: MyChart Message (if you have MyChart) OR A paper copy in the mail If you have any lab test that is abnormal or we need to change your treatment, we will call you to review the results.  Testing/Procedures: NONE ordered at this time of appointment   Follow-Up: At [facility], you and your health needs are our priority.  As part of our continuing mission to provide you with exceptional heart care, we have created designated Provider Care  Teams.  These Care Teams include your primary Cardiologist (physician) and Advanced Practice Providers (APPs -  Physician Assistants and Nurse Practitioners) who all work together to provide you with the care you need, when you need it.   Your next appointment:   4-6 week(s)  The format for your next appointment:   In Person  Provider:   Azalee Course, PA-C       Other Instructions   Important Information About Sugar         Ramond Dial, [place]  06/18/2022 11:04 PM    [facility]

## 2022-06-18 ENCOUNTER — Encounter: Payer: Self-pay | Admitting: Physician Assistant

## 2022-06-30 ENCOUNTER — Other Ambulatory Visit: Payer: Self-pay

## 2022-06-30 DIAGNOSIS — N179 Acute kidney failure, unspecified: Secondary | ICD-10-CM

## 2022-06-30 LAB — BASIC METABOLIC PANEL
BUN/Creatinine Ratio: 12 (ref 10–24)
BUN: 15 mg/dL (ref 8–27)
CO2: 25 mmol/L (ref 20–29)
Calcium: 9.3 mg/dL (ref 8.6–10.2)
Chloride: 98 mmol/L (ref 96–106)
Creatinine, Ser: 1.3 mg/dL — ABNORMAL HIGH (ref 0.76–1.27)
Glucose: 126 mg/dL — ABNORMAL HIGH (ref 70–99)
Potassium: 4.4 mmol/L (ref 3.5–5.2)
Sodium: 135 mmol/L (ref 134–144)
eGFR: 59 mL/min/{1.73_m2} — ABNORMAL LOW (ref >59)

## 2022-07-12 ENCOUNTER — Ambulatory Visit: Payer: Medicare HMO | Admitting: Physician Assistant

## 2022-07-14 ENCOUNTER — Other Ambulatory Visit: Payer: Self-pay

## 2022-07-14 DIAGNOSIS — N179 Acute kidney failure, unspecified: Secondary | ICD-10-CM

## 2022-07-15 LAB — BASIC METABOLIC PANEL
BUN/Creatinine Ratio: 14 (ref 10–24)
BUN: 18 mg/dL (ref 8–27)
CO2: 24 mmol/L (ref 20–29)
Calcium: 9.6 mg/dL (ref 8.6–10.2)
Chloride: 99 mmol/L (ref 96–106)
Creatinine, Ser: 1.26 mg/dL (ref 0.76–1.27)
Glucose: 135 mg/dL — ABNORMAL HIGH (ref 70–99)
Potassium: 5 mmol/L (ref 3.5–5.2)
Sodium: 137 mmol/L (ref 134–144)
eGFR: 62 mL/min/{1.73_m2} (ref >59)

## 2022-07-19 ENCOUNTER — Telehealth: Payer: Self-pay | Admitting: *Deleted

## 2022-07-19 NOTE — Patient Outreach (Signed)
  Care Coordination   07/19/2022 Name: DEARIES MEIKLE MRN: [id number] DOB: 1951/09/25   Care Coordination Outreach Attempts:  An unsuccessful telephone outreach was attempted today to offer the patient information about available care coordination services as a benefit of their health plan.   Follow Up Plan:  Additional outreach attempts will be made to offer the patient care coordination information and services.   Encounter Outcome:  No Answer  Care Coordination Interventions Activated:  No   Care Coordination Interventions:  No, not indicated    [name] [facility], [place] RN Care Coordinator [phone]

## 2022-07-20 ENCOUNTER — Telehealth: Payer: Self-pay | Admitting: *Deleted

## 2022-07-20 NOTE — Patient Outreach (Signed)
  Care Coordination   07/20/2022 Name: Cory Larson MRN: [id number] DOB: 1950-12-25   Care Coordination Outreach Attempts:  A second unsuccessful outreach was attempted today to offer the patient with information about available care coordination services as a benefit of their health plan.     Follow Up Plan:  Additional outreach attempts will be made to offer the patient care coordination information and services.   Encounter Outcome:  No Answer  Care Coordination Interventions Activated:  No   Care Coordination Interventions:  No, not indicated    [name] [facility], [place] RN Care Coordinator [phone]

## 2022-07-21 ENCOUNTER — Telehealth: Payer: Self-pay | Admitting: *Deleted

## 2022-07-21 NOTE — Patient Outreach (Signed)
  Care Coordination   07/21/2022 Name: Cory Larson MRN: [id number] DOB: September 16, 1951   Care Coordination Outreach Attempts:  A third unsuccessful outreach was attempted today to offer the patient with information about available care coordination services as a benefit of their health plan.   Follow Up Plan:  No further outreach attempts will be made at this time. We have been unable to contact the patient to offer or enroll patient in care coordination services  Encounter Outcome:  No Answer  Care Coordination Interventions Activated:  No   Care Coordination Interventions:  No, not indicated    [name] [facility], BSN [facility] RN Care Coordinator [phone]

## 2022-07-24 ENCOUNTER — Ambulatory Visit: Payer: Medicare HMO | Attending: Physician Assistant | Admitting: Physician Assistant

## 2022-07-24 ENCOUNTER — Encounter: Payer: Self-pay | Admitting: Physician Assistant

## 2022-07-24 VITALS — BP 126/80 | HR 69 | Ht 71.0 in | Wt 207.8 lb

## 2022-07-24 DIAGNOSIS — N179 Acute kidney failure, unspecified: Secondary | ICD-10-CM | POA: Diagnosis not present

## 2022-07-24 DIAGNOSIS — E785 Hyperlipidemia, unspecified: Secondary | ICD-10-CM | POA: Diagnosis not present

## 2022-07-24 DIAGNOSIS — E119 Type 2 diabetes mellitus without complications: Secondary | ICD-10-CM | POA: Diagnosis not present

## 2022-07-24 DIAGNOSIS — I10 Essential (primary) hypertension: Secondary | ICD-10-CM | POA: Diagnosis not present

## 2022-07-24 DIAGNOSIS — I2510 Atherosclerotic heart disease of native coronary artery without angina pectoris: Secondary | ICD-10-CM | POA: Diagnosis not present

## 2022-07-24 DIAGNOSIS — I255 Ischemic cardiomyopathy: Secondary | ICD-10-CM

## 2022-07-24 NOTE — Patient Instructions (Signed)
Medication Instructions:  Your physician recommends that you continue on your current medications as directed. Please refer to the Current Medication list given to you today.  *If you need a refill on your cardiac medications before your next appointment, please call your pharmacy*   Lab Work: NONE If you have labs (blood work) drawn today and your tests are completely normal, you will receive your results only by: [place] (if you have MyChart) OR A paper copy in the mail If you have any lab test that is abnormal or we need to change your treatment, we will call you to review the results.   Testing/Procedures: NONE   Follow-Up: At [facility], you and your health needs are our priority.  As part of our continuing mission to provide you with exceptional heart care, we have created designated Provider Care Teams.  These Care Teams include your primary Cardiologist (physician) and Advanced Practice Providers (APPs -  Physician Assistants and Nurse Practitioners) who all work together to provide you with the care you need, when you need it.  We recommend signing up for the patient portal called "MyChart".  Sign up information is provided on this After Visit Summary.  MyChart is used to connect with patients for Virtual Visits (Telemedicine).  Patients are able to view lab/test results, encounter notes, upcoming appointments, etc.  Non-urgent messages can be sent to your provider as well.   To learn more about what you can do with MyChart, go to [email or web address].    Your next appointment:   4-6 month(s)  The format for your next appointment:   In Person  Provider:   Quay Burow, MD

## 2022-07-24 NOTE — Progress Notes (Unsigned)
Cardiology Office Note:    Date:  07/24/2022   ID:  Cory Larson, DOB October 15, 1951, MRN [id number]  PCP:  Nickola Major, MD   [facility] [facility] Cardiologist:  Quay Burow, MD     Referring MD: Nickola Major, MD   Chief Complaint  Patient presents with   Follow-up    4-6 weeks follow up    History of Present Illness:    Cory Larson is a 71 y.o. male with a hx of HTN, HLD, DM2, ICM and CAD.  He was previously admitted in September 2021 with inferolateral STEMI secondary to occlusion of the large dominant RCA, EF was 35 to 45% at the time with severe inferior and inferior apical wall motion abnormality.  This was treated with with Synergy XD 3.0 x 32 mm DES.  There was no other significant coronary artery disease.  Initial echocardiogram showed EF 30 to 35% with akinesis in the RCA territory, grade 1 DD.  He was started on Entresto, carvedilol and antiplatelet therapy. Subsequent echocardiogram in January 2022 which continue to show EF 30 to 35%.  He was referred to Dr. Sallyanne Kuster for evaluation of ICD implantation, however patient was hesitant to pursue this.   Last echocardiogram in May 2022 showed EF is unchanged at 30 to 35%, grade 1 DD, no significant valve issue.  He was last seen by Dr. [name] on 02/16/2021 at which time he was doing well.  Most recently, patient was seen in the ED on 05/11/2022 with AKI, creatinine trended up to 2.15 when checked by PCP.  Spironolactone, Entresto and metformin were discontinued.  Repeat blood work in the emergency room shows creatinine has came down to 1.95.  Renal artery Doppler showed no sonographic abnormality seen in the kidneys, no hydronephrosis.  I last saw the patient on 06/16/2022, I wanted to restart on Entresto.  I gave him 2 weeks worth of 24-26 mg twice a day of Entresto sample and asked him to go back to the higher dose 49-51 mg twice a day after that.  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  I asked him to keep adequate hydration.  Patient presents today for follow-up.  Overall he has been doing well without significant chest discomfort or worsening dyspnea.  Based on recent serial blood work, renal function is improving as well.  We ultimately did did not restart him on spironolactone.  He never picked up the Jardiance prescription either.  His blood pressure is stable on the current therapy.  Although I recommend that he try to do the Entresto twice a day, apparently he is only taking it once a day.  His blood pressure get down to the low 110s at night which make him hesitant to take the nighttime dose of Entresto.  Otherwise, he is doing well since the recent hospitalization.  He can follow-up with Dr. [name] in 4 to 6 months.  Past Medical History:  Diagnosis Date   Diabetes mellitus without complication (HCC)    High cholesterol    Hyperlipidemia    Hypertension    STEMI (ST elevation myocardial infarction) ([place])    Systolic heart failure ([place])     Past Surgical History:  Procedure Laterality Date   CORONARY STENT INTERVENTION N/A 07/10/2020   Procedure: CORONARY STENT INTERVENTION;  Surgeon: Lorretta Harp, MD;  Location: [facility];  Service: Cardiovascular;  Laterality: N/A;  CORONARY THROMBECTOMY N/A 07/10/2020   Procedure: Coronary Thrombectomy;  Surgeon: Lorretta Harp, MD;  Location: [facility];  Service: Cardiovascular;  Laterality: N/A;   CORONARY/GRAFT ACUTE MI REVASCULARIZATION N/A 07/10/2020   Procedure: Coronary/Graft Acute MI Revascularization;  Surgeon: Lorretta Harp, MD;  Location: [facility] [facility];  Service: Cardiovascular;  Laterality: N/A;   LEFT HEART CATH AND CORONARY ANGIOGRAPHY N/A 07/10/2020   Procedure: LEFT HEART CATH AND CORONARY ANGIOGRAPHY;  Surgeon: Lorretta Harp, MD;  Location: [facility];  Service: Cardiovascular;  Laterality: N/A;    Current Medications: Current  Meds  Medication Sig   aspirin (CVS ASPIRIN ADULT LOW DOSE) 81 MG chewable tablet CHEW AND SWALLOW 1 TABLET BY MOUTH EVERY DAY   BRILINTA 90 MG TABS tablet TAKE 1 TABLET BY MOUTH TWICE A DAY   carvedilol (COREG) 3.125 MG tablet TAKE 1 TABLET BY MOUTH TWICE A DAY   Cyanocobalamin 1000 MCG/ML LIQD Take by mouth daily.   ENTRESTO 49-51 MG TAKE 1 TABLET BY MOUTH TWICE A DAY   glipiZIDE (GLUCOTROL XL) 5 MG 24 hr tablet Take 5 mg by mouth daily.   metFORMIN (GLUCOPHAGE) 500 MG tablet Take 500 mg by mouth daily.   nitroGLYCERIN (NITROSTAT) 0.4 MG SL tablet Place 1 tablet (0.4 mg total) under the tongue every 5 (five) minutes x 3 doses as needed for chest pain.   rosuvastatin (CRESTOR) 40 MG tablet Take 1 tablet (40 mg total) by mouth daily.     Allergies:   Patient has no known allergies.   Social History   Socioeconomic History   Marital status: Single    Spouse name: Not on file   Number of children: Not on file   Years of education: Not on file   Highest education level: Not on file  Occupational History   Not on file  Tobacco Use   Smoking status: Never   Smokeless tobacco: Never  Substance and Sexual Activity   Alcohol use: Not on file   Drug use: Not on file   Sexual activity: Not on file  Other Topics Concern   Not on file  Social History Narrative   Not on file   Social Determinants of Health   Financial Resource Strain: Not on file  Food Insecurity: Not on file  Transportation Needs: Not on file  Physical Activity: Not on file  Stress: Not on file  Social Connections: Not on file     Family History: The patient's family history includes Heart disease in his paternal grandfather.  ROS:   Please see the history of present illness.     All other systems reviewed and are negative.  EKGs/Labs/Other Studies Reviewed:    The following studies were reviewed today:  Echo 03/14/2021 1. Left ventricular ejection fraction, by estimation, is 30 to 35%. The  left  ventricle has moderately decreased function. The left ventricle  demonstrates global hypokinesis. Left ventricular diastolic parameters are  consistent with Grade I diastolic  dysfunction (impaired relaxation).   2. Right ventricular systolic function is normal. The right ventricular  size is normal.   3. The mitral valve is normal in structure. No evidence of mitral valve  regurgitation. No evidence of mitral stenosis.   4. The aortic valve is normal in structure. Aortic valve regurgitation is  not visualized. No aortic stenosis is present.   5. The inferior vena cava is normal in size with greater than 50%  respiratory variability, suggesting right atrial pressure  of 3 mmHg.   Comparison(s): No significant change from prior study. Prior images  reviewed side by side.   EKG:  EKG is not ordered today.    Recent Labs: 05/11/2022: ALT 17; Hemoglobin 11.5; Platelets 233 07/14/2022: BUN 18; Creatinine, Ser 1.26; Potassium 5.0; Sodium 137  Recent Lipid Panel    Component Value Date/Time   CHOL 133 07/11/2020 0121   TRIG 79 07/11/2020 0121   HDL 54 07/11/2020 0121   CHOLHDL 2.5 07/11/2020 0121   VLDL 16 07/11/2020 0121   LDLCALC 63 07/11/2020 0121     Risk Assessment/Calculations:           Physical Exam:    VS:  BP 126/80   Pulse 69   Ht 5\' 11"  (1.803 m)   Wt 207 lb 12.8 oz (94.3 kg)   SpO2 99%   BMI 28.98 kg/m         Wt Readings from Last 3 Encounters:  07/24/22 207 lb 12.8 oz (94.3 kg)  06/16/22 205 lb 12.8 oz (93.4 kg)  02/16/21 204 lb 3.2 oz (92.6 kg)     GEN:  Well nourished, well developed in no acute distress HEENT: Normal NECK: No JVD; No carotid bruits LYMPHATICS: No lymphadenopathy CARDIAC: RRR, no murmurs, rubs, gallops RESPIRATORY:  Clear to auscultation without rales, wheezing or rhonchi  ABDOMEN: Soft, non-tender, non-distended MUSCULOSKELETAL:  No edema; No deformity  SKIN: Warm and dry NEUROLOGIC:  Alert and oriented x 3 PSYCHIATRIC:   Normal affect   ASSESSMENT:    1. Coronary artery disease involving native coronary artery of native heart without angina pectoris   2. Primary hypertension   3. Hyperlipidemia LDL goal <70   4. Ischemic cardiomyopathy   5. Controlled type 2 diabetes mellitus without complication, without long-term current use of insulin ([place])   6. AKI (acute kidney injury) ([place])    PLAN:    In order of problems listed above:  CAD: Denies any recent chest discomfort.  Continue aspirin and Brilinta  Hypertension: Blood pressure stable  Hyperlipidemia: On Crestor  Ischemic cardiomyopathy: Continue carvedilol and Entresto.  I did not restart his spironolactone and Jardiance due to AKI  DM2: Managed by primary care provider  AKI: Renal function improved based on recent blood work.           Medication Adjustments/Labs and Tests Ordered: Current medicines are reviewed at length with the patient today.  Concerns regarding medicines are outlined above.  No orders of the defined types were placed in this encounter.  No orders of the defined types were placed in this encounter.   Patient Instructions  Medication Instructions:  Your physician recommends that you continue on your current medications as directed. Please refer to the Current Medication list given to you today.  *If you need a refill on your cardiac medications before your next appointment, please call your pharmacy*   Lab Work: NONE If you have labs (blood work) drawn today and your tests are completely normal, you will receive your results only by: [place] (if you have MyChart) OR A paper copy in the mail If you have any lab test that is abnormal or we need to change your treatment, we will call you to review the results.   Testing/Procedures: NONE   Follow-Up: At [facility], you and your health needs are our priority.  As part of our continuing mission to provide you with exceptional heart care, we  have created designated Provider Care Teams.  These  Care Teams include your primary Cardiologist (physician) and Advanced Practice Providers (APPs -  Physician Assistants and Nurse Practitioners) who all work together to provide you with the care you need, when you need it.  We recommend signing up for the patient portal called "MyChart".  Sign up information is provided on this After Visit Summary.  MyChart is used to connect with patients for Virtual Visits (Telemedicine).  Patients are able to view lab/test results, encounter notes, upcoming appointments, etc.  Non-urgent messages can be sent to your provider as well.   To learn more about what you can do with MyChart, go to [email or web address].    Your next appointment:   4-6 month(s)  The format for your next appointment:   In Person  Provider:   Quay Burow, MD      Signed, Almyra Deforest, [place]  07/24/2022 9:24 AM    [place]

## 2022-08-08 ENCOUNTER — Other Ambulatory Visit: Payer: Self-pay | Admitting: Cardiovascular Disease

## 2022-08-17 DIAGNOSIS — R809 Proteinuria, unspecified: Secondary | ICD-10-CM | POA: Diagnosis not present

## 2022-08-17 DIAGNOSIS — Z23 Encounter for immunization: Secondary | ICD-10-CM | POA: Diagnosis not present

## 2022-08-17 DIAGNOSIS — Z7984 Long term (current) use of oral hypoglycemic drugs: Secondary | ICD-10-CM | POA: Diagnosis not present

## 2022-08-17 DIAGNOSIS — N1831 Chronic kidney disease, stage 3a: Secondary | ICD-10-CM | POA: Diagnosis not present

## 2022-08-17 DIAGNOSIS — E1129 Type 2 diabetes mellitus with other diabetic kidney complication: Secondary | ICD-10-CM | POA: Diagnosis not present

## 2022-09-10 ENCOUNTER — Other Ambulatory Visit: Payer: Self-pay | Admitting: Cardiovascular Disease

## 2022-09-12 DIAGNOSIS — H52203 Unspecified astigmatism, bilateral: Secondary | ICD-10-CM | POA: Diagnosis not present

## 2022-09-12 DIAGNOSIS — E119 Type 2 diabetes mellitus without complications: Secondary | ICD-10-CM | POA: Diagnosis not present

## 2022-09-12 DIAGNOSIS — Z7984 Long term (current) use of oral hypoglycemic drugs: Secondary | ICD-10-CM | POA: Diagnosis not present

## 2022-09-12 DIAGNOSIS — H524 Presbyopia: Secondary | ICD-10-CM | POA: Diagnosis not present

## 2022-09-12 DIAGNOSIS — H5203 Hypermetropia, bilateral: Secondary | ICD-10-CM | POA: Diagnosis not present

## 2022-09-12 DIAGNOSIS — Z961 Presence of intraocular lens: Secondary | ICD-10-CM | POA: Diagnosis not present

## 2022-10-07 IMAGING — CT CT HEAD W/O CM
4 series · 15 of 47 positions shown, 17 images · non-contrast
Comparison: No pertinent prior exams available for comparison.

CLINICAL DATA: Provided history: Anisocoria appreciated by
physician 1 week ago.

EXAM:
CT HEAD WITHOUT CONTRAST
TECHNIQUE: Contiguous axial images were obtained from the base of the skull
through the vertex without intravenous contrast.

[Series 2: head 5.00 hr40 s3 axial ibhc · axial · 0.46mm/px · z∈[-609,-489]mm · 7 of 33 slices shown, 9 images]
[im 5/33  brain]
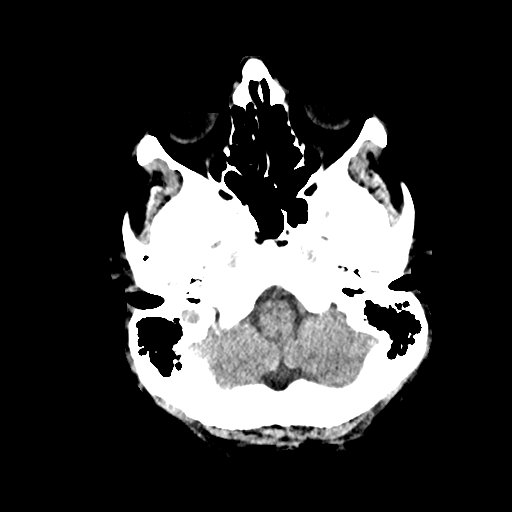
[im 5/33  bone]
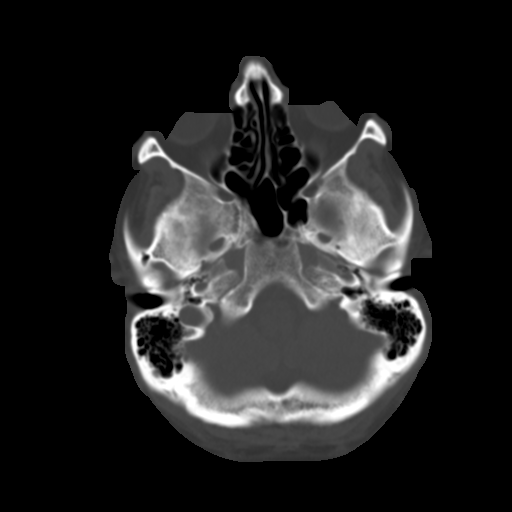
[im 9/33  brain]
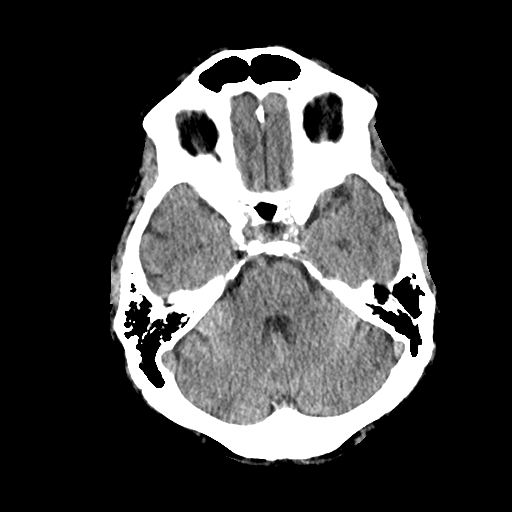
[im 13/33  brain]
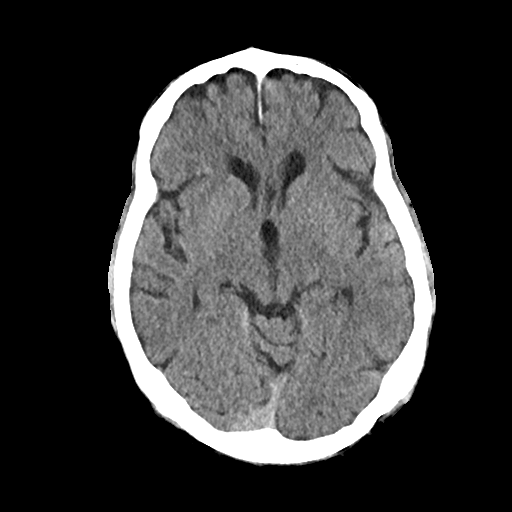
[im 17/33  brain]
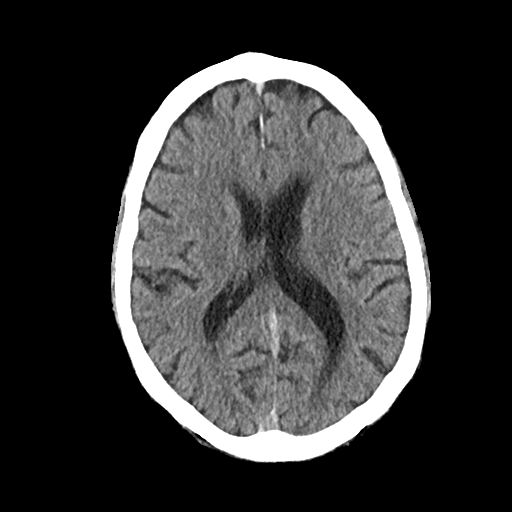
[im 21/33  brain]
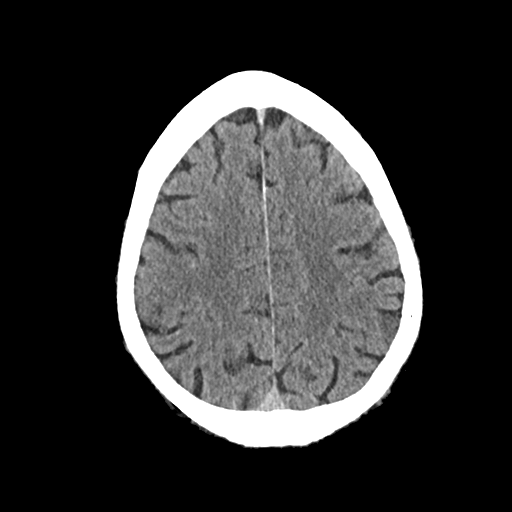
[im 21/33  bone]
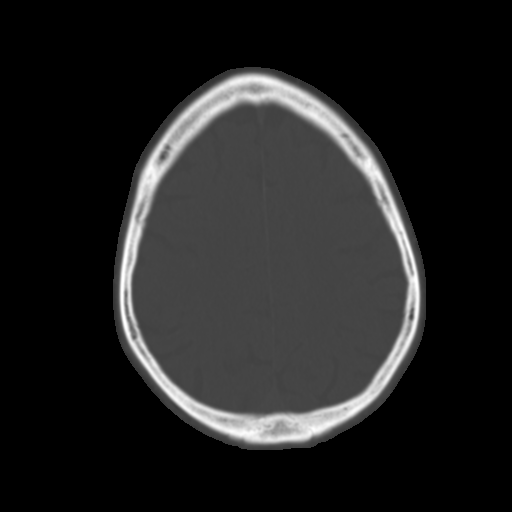
[im 25/33  brain]
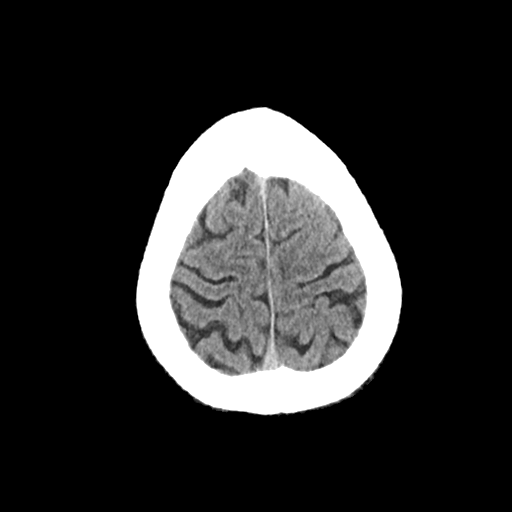
[im 29/33  brain]
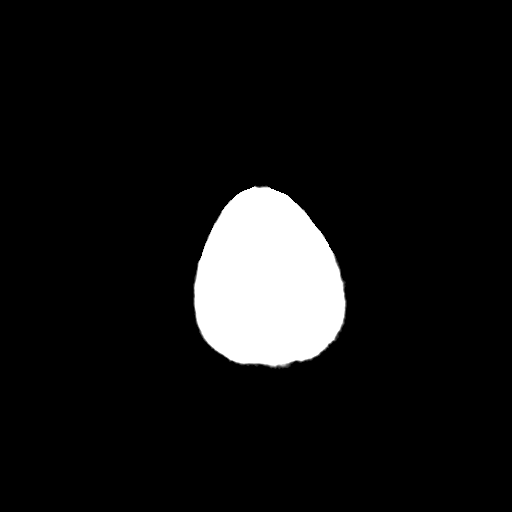

[Series 3: head 2.00 hr60 s3 axial bone · axial · 0.46mm/px · z∈[-615,-599]mm · 2 of 82 slices shown]
[im 9/82  bone]
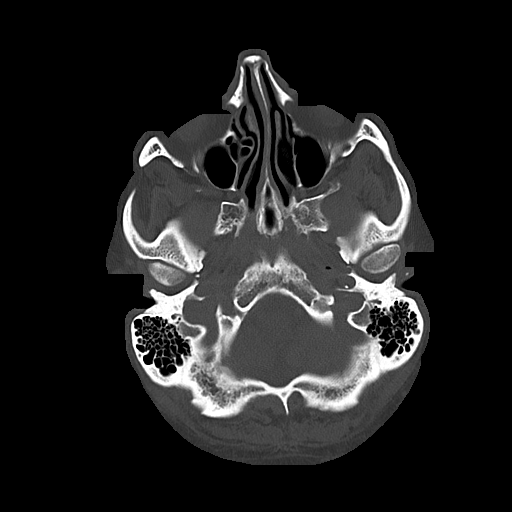
[im 17/82  bone]
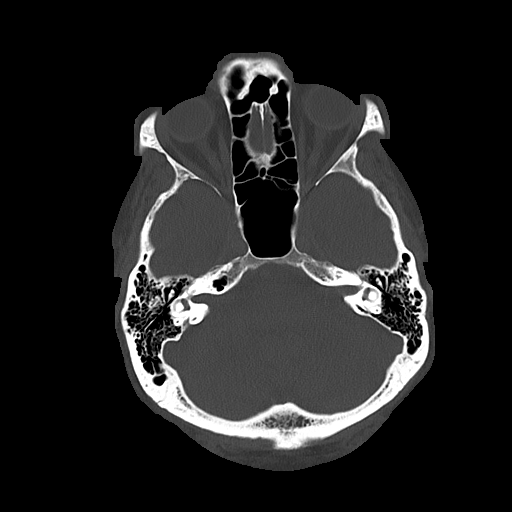

[Series 4: head 3.00 hr40 s3 sag · sagittal · 0.32mm/px · 3 of 71 slices shown]
[im 24/71  brain]
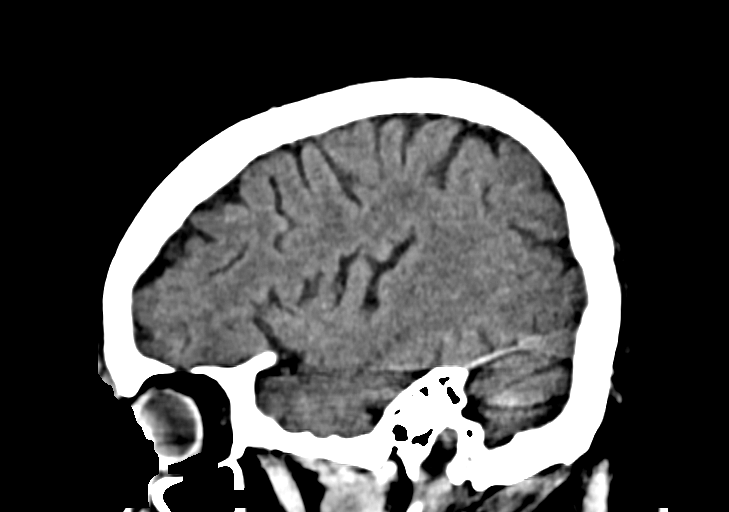
[im 36/71  brain]
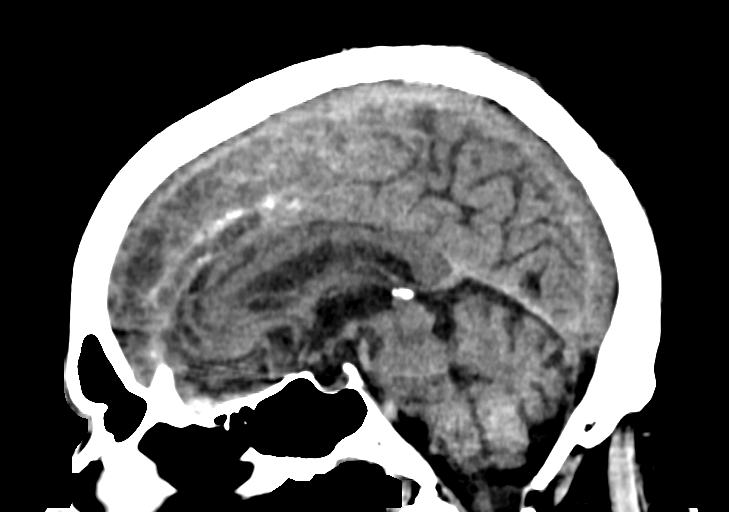
[im 47/71  brain]
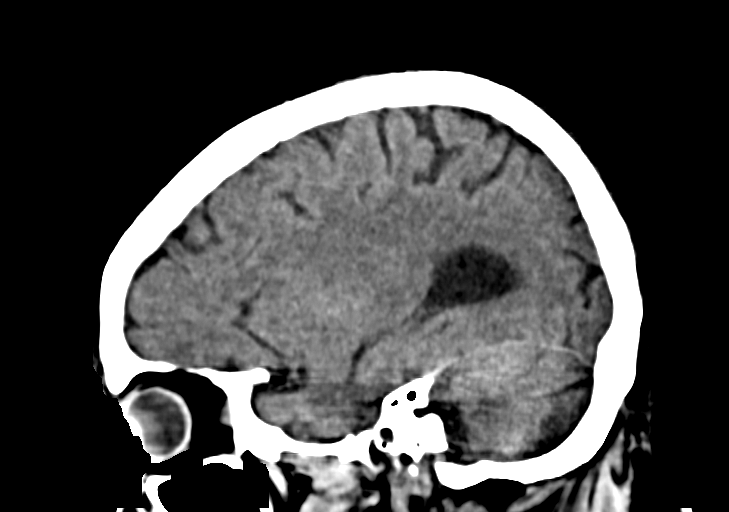

[Series 6: head 3.00 hr40 s3 cor · coronal · 0.32mm/px · 3 of 78 slices shown]
[im 26/78  brain]
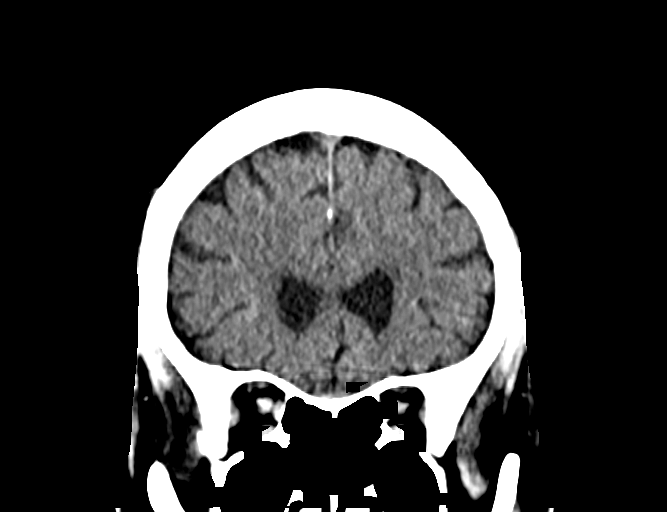
[im 35/78  brain]
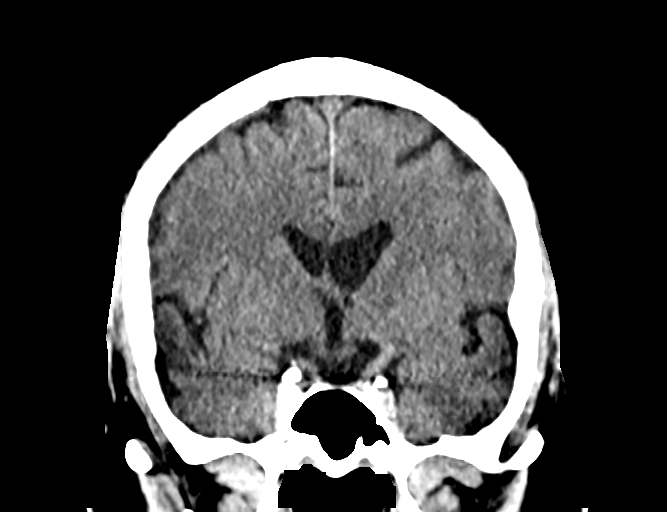
[im 43/78  brain]
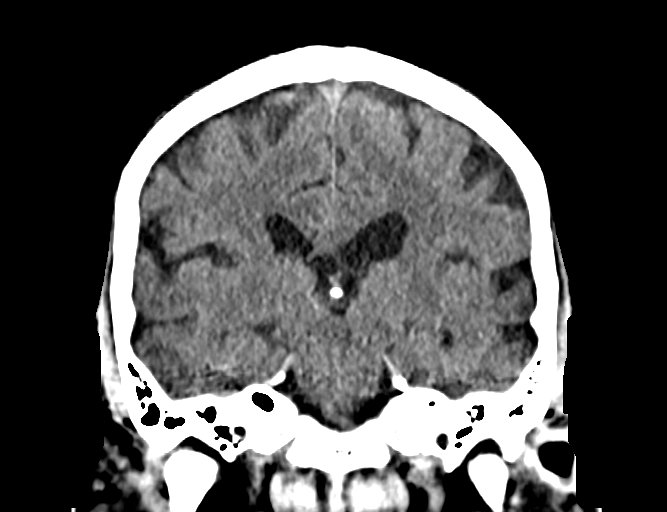

[15 of 47 positions shown; findings below may reference images not displayed]

FINDINGS: Brain:

Mild generalized cerebral atrophy.

There is no acute intracranial hemorrhage.

No demarcated cortical infarct.

No extra-axial fluid collection.

No evidence of intracranial mass.

No midline shift.

Partially empty sella turcica.

Vascular: No hyperdense vessel.  Atherosclerotic calcifications.

Skull: Normal. Negative for fracture or focal lesion.

Sinuses/Orbits: Visualized orbits show no acute finding. No
significant paranasal sinus disease at the imaged levels.
IMPRESSION: No evidence of acute intracranial abnormality.

Mild generalized cerebral atrophy.

## 2022-10-10 ENCOUNTER — Other Ambulatory Visit: Payer: Self-pay | Admitting: Cardiovascular Disease

## 2022-11-06 DIAGNOSIS — N179 Acute kidney failure, unspecified: Secondary | ICD-10-CM | POA: Diagnosis not present

## 2022-11-09 ENCOUNTER — Other Ambulatory Visit: Payer: Self-pay | Admitting: Cardiovascular Disease

## 2022-11-14 DIAGNOSIS — N183 Chronic kidney disease, stage 3 unspecified: Secondary | ICD-10-CM | POA: Diagnosis not present

## 2022-11-14 DIAGNOSIS — I129 Hypertensive chronic kidney disease with stage 1 through stage 4 chronic kidney disease, or unspecified chronic kidney disease: Secondary | ICD-10-CM | POA: Diagnosis not present

## 2022-11-24 ENCOUNTER — Ambulatory Visit: Payer: Medicare HMO | Attending: Cardiovascular Disease | Admitting: Cardiovascular Disease

## 2022-11-24 ENCOUNTER — Other Ambulatory Visit: Payer: Self-pay | Admitting: Pharmacist

## 2022-11-24 ENCOUNTER — Encounter: Payer: Self-pay | Admitting: Cardiovascular Disease

## 2022-11-24 VITALS — BP 122/64 | HR 49 | Ht 71.0 in | Wt 209.8 lb

## 2022-11-24 DIAGNOSIS — E785 Hyperlipidemia, unspecified: Secondary | ICD-10-CM | POA: Diagnosis not present

## 2022-11-24 DIAGNOSIS — I252 Old myocardial infarction: Secondary | ICD-10-CM

## 2022-11-24 LAB — HEPATIC FUNCTION PANEL
ALT: 19 IU/L (ref 0–44)
AST: 26 IU/L (ref 0–40)
Albumin: 4.6 g/dL (ref 3.9–4.9)
Alkaline Phosphatase: 78 IU/L (ref 44–121)
Bilirubin Total: 0.5 mg/dL (ref 0.0–1.2)
Bilirubin, Direct: 0.24 mg/dL (ref 0.00–0.40)
Total Protein: 7.3 g/dL (ref 6.0–8.5)

## 2022-11-24 LAB — LIPID PANEL
Chol/HDL Ratio: 2.3 ratio (ref 0.0–5.0)
Cholesterol, Total: 114 mg/dL (ref 100–199)
HDL: 50 mg/dL (ref >39)
LDL Chol Calc (NIH): 45 mg/dL (ref 0–99)
Triglycerides: 101 mg/dL (ref 0–149)
VLDL Cholesterol Cal: 19 mg/dL (ref 5–40)

## 2022-11-24 MED ORDER — CLOPIDOGREL BISULFATE 75 MG PO TABS: 75.0000 mg | ORAL_TABLET | Freq: Every day | ORAL | 3 refills | Status: DC

## 2022-11-24 NOTE — Assessment & Plan Note (Signed)
History of inferior STEMI 07/10/2020 treated with RCA PCI and stenting using a 3 mm x 32 mm long Synergy drug-eluting stent postdilated to 3.33 mm.  He had no other significant CAD.  He has remained on aspirin and Brilinta.  I am going to transition him to clopidogrel.  He denies chest pain or shortness of breath.

## 2022-11-24 NOTE — Patient Instructions (Signed)
Medication Instructions:  Your physician has recommended you make the following change in your medication:   -Stop Brilinta.  -Start clopidogrel (plavix) 75mg  once daily.  *If you need a refill on your cardiac medications before your next appointment, please call your pharmacy*   Lab Work: Your physician recommends that you have labs drawn today: Lipid/Liver panel  If you have labs (blood work) drawn today and your tests are completely normal, you will receive your results only by: MyChart Message (if you have MyChart) OR A paper copy in the mail If you have any lab test that is abnormal or we need to change your treatment, we will call you to review the results.   Testing/Procedures: Your physician has requested that you have an echocardiogram. Echocardiography is a painless test that uses sound waves to create images of your heart. It provides your doctor with information about the size and shape of your heart and how well your heart's chambers and valves are working. This procedure takes approximately one hour. There are no restrictions for this procedure. Please do NOT wear cologne, perfume, aftershave, or lotions (deodorant is allowed). Please arrive 15 minutes prior to your appointment time. This procedure will be done at [address]    Follow-Up: At [facility], you and your health needs are our priority.  As part of our continuing mission to provide you with exceptional heart care, we have created designated Provider Care Teams.  These Care Teams include your primary Cardiologist (physician) and Advanced Practice Providers (APPs -  Physician Assistants and Nurse Practitioners) who all work together to provide you with the care you need, when you need it.  We recommend signing up for the patient portal called "MyChart".  Sign up information is provided on this After Visit Summary.  MyChart is used to connect with patients for Virtual Visits (Telemedicine).   Patients are able to view lab/test results, encounter notes, upcoming appointments, etc.  Non-urgent messages can be sent to your provider as well.   To learn more about what you can do with MyChart, go to [email or web address].    Your next appointment:   12 month(s)  Provider:   Quay Burow, MD

## 2022-11-24 NOTE — Assessment & Plan Note (Signed)
History of ischemic cardiomyopathy with an EF that has been persistently low in the 30 to 35% range most recently checked 03/04/2021.  He is on low-dose carvedilol limited by bradycardia and Entresto.  Spironolactone was discontinued because of his.  He is completely asymptomatic from the point of view of heart failure.  We have talked about ICD implantation for primary prevention given his low EF which he has been resistant to pursuing because of financial considerations.  I am going to repeat a 2D echocardiogram.

## 2022-11-24 NOTE — Assessment & Plan Note (Signed)
History of dyslipidemia on statin therapy lipid profile performed 07/11/2020 revealing a total cholesterol 133, LDL 63 and HDL 34.

## 2022-11-24 NOTE — Progress Notes (Signed)
11/24/2022 [name]   1951-03-14  [id number]  Primary Physician Nickola Major, MD Primary Cardiologist: Lorretta Harp MD Lupe Carney, [place]  HPI:  Cory Larson is a 72 y.o.    fit appearing single Caucasian male father of 2 children who works as a Armed forces training and education officer but developed sudden onset chest pain at 8:30 in the morning.  I last saw him in the office 02/16/2021.  He has seen several times in the interim by Almyra Deforest PA-C .  Cardiac risk factors include treated diabetes, hyperlipidemia and untreated hypertension.  He does not smoke.  Never had a heart attack or stroke.  He did dial 911 and was brought by EMS to [facility] emergency room where his EKG showed inferolateral ST segment elevation.  His pain somewhat improved with sublingual nitroglycerin.  He was hypertensive in the ER with a blood pressure of 170/110 although his pain had markedly improved.  He did have persistent ST segment elevation.  He was brought urgently to the Cath Lab for angiography and intervention.   He was discharged home 2 days later.  He is started on Entresto and low-dose carvedilol.  He remains on dual antiplatelet therapy.  He denies chest pain but has had a little fatigue on maximal exertion.  Recent 2D echo revealed an EF of 30 to 35% on 11/04/2020 consistent with continued LV dysfunction.   I did refer him to Dr. Sallyanne Kuster for consideration of ICD implantation for primary prevention however the patient was hesitant to pursue this.  He has been very active and is completely asymptomatic.  Since I saw him a year and a half ago he continues to do well.  He denies chest pain or shortness of breath.  He is fairly active, walks 2 miles a day and plays pickle ball without limitation.   Current Meds  Medication Sig   amLODipine (NORVASC) 5 MG tablet Take 5 mg by mouth daily.   BRILINTA 90 MG TABS tablet TAKE 1 TABLET BY MOUTH TWICE A DAY   carvedilol (COREG) 3.125 MG tablet TAKE 1 TABLET BY  MOUTH TWICE A DAY   CVS ASPIRIN ADULT LOW DOSE 81 MG chewable tablet CHEW AND SWALLOW 1 TABLET BY MOUTH EVERY DAY   Cyanocobalamin 1000 MCG/ML LIQD Take by mouth daily.   ENTRESTO 49-51 MG TAKE 1 TABLET BY MOUTH TWICE A DAY   FARXIGA 10 MG TABS tablet    glipiZIDE (GLUCOTROL XL) 5 MG 24 hr tablet Take 5 mg by mouth daily.   metFORMIN (GLUCOPHAGE) 500 MG tablet Take 500 mg by mouth daily.   nitroGLYCERIN (NITROSTAT) 0.4 MG SL tablet Place 1 tablet (0.4 mg total) under the tongue every 5 (five) minutes x 3 doses as needed for chest pain.   rosuvastatin (CRESTOR) 40 MG tablet Take 1 tablet (40 mg total) by mouth daily.     No Known Allergies  Social History   Socioeconomic History   Marital status: Single    Spouse name: Not on file   Number of children: Not on file   Years of education: Not on file   Highest education level: Not on file  Occupational History   Not on file  Tobacco Use   Smoking status: Never   Smokeless tobacco: Never  Substance and Sexual Activity   Alcohol use: Not on file   Drug use: Not on file   Sexual activity: Not on file  Other Topics Concern   Not  on file  Social History Narrative   Not on file   Social Determinants of Health   Financial Resource Strain: Not on file  Food Insecurity: Not on file  Transportation Needs: Not on file  Physical Activity: Not on file  Stress: Not on file  Social Connections: Not on file  Intimate Partner Violence: Not on file     Review of Systems: General: negative for chills, fever, night sweats or weight changes.  Cardiovascular: negative for chest pain, dyspnea on exertion, edema, orthopnea, palpitations, paroxysmal nocturnal dyspnea or shortness of breath Dermatological: negative for rash Respiratory: negative for cough or wheezing Urologic: negative for hematuria Abdominal: negative for nausea, vomiting, diarrhea, bright red blood per rectum, melena, or hematemesis Neurologic: negative for visual changes,  syncope, or dizziness All other systems reviewed and are otherwise negative except as noted above.    Blood pressure 122/64, pulse (!) 49, height 5\' 11"  (1.803 m), weight 209 lb 12.8 oz (95.2 kg), SpO2 100 %.  General appearance: alert and no distress Neck: no adenopathy, no carotid bruit, no JVD, supple, symmetrical, trachea midline, and thyroid not enlarged, symmetric, no tenderness/mass/nodules Lungs: clear to auscultation bilaterally Heart: regular rate and rhythm, S1, S2 normal, no murmur, click, rub or gallop Extremities: extremities normal, atraumatic, no cyanosis or edema Pulses: 2+ and symmetric Skin: Skin color, texture, turgor normal. No rashes or lesions Neurologic: Grossly normal  EKG sinus bradycardia 49 without ST or T wave changes.  There was a nonspecific IVCD.  I personally reviewed this EKG.  ASSESSMENT AND PLAN:   History of ST elevation myocardial infarction (STEMI) History of inferior STEMI 07/10/2020 treated with RCA PCI and stenting using a 3 mm x 32 mm long Synergy drug-eluting stent postdilated to 3.33 mm.  He had no other significant CAD.  He has remained on aspirin and Brilinta.  I am going to transition him to clopidogrel.  He denies chest pain or shortness of breath.  Dyslipidemia, goal LDL below 70 History of dyslipidemia on statin therapy lipid profile performed 07/11/2020 revealing a total cholesterol 133, LDL 63 and HDL 34.  Ischemic cardiomyopathy History of ischemic cardiomyopathy with an EF that has been persistently low in the 30 to 35% range most recently checked 03/04/2021.  He is on low-dose carvedilol limited by bradycardia and Entresto.  Spironolactone was discontinued because of his.  He is completely asymptomatic from the point of view of heart failure.  We have talked about ICD implantation for primary prevention given his low EF which he has been resistant to pursuing because of financial considerations.  I am going to repeat a 2D  echocardiogram.     Lorretta Harp MD [facility], [facility] 11/24/2022 9:27 AM

## 2022-12-09 DIAGNOSIS — U071 COVID-19: Secondary | ICD-10-CM | POA: Diagnosis not present

## 2022-12-11 ENCOUNTER — Other Ambulatory Visit: Payer: Self-pay | Admitting: Cardiovascular Disease

## 2022-12-15 ENCOUNTER — Other Ambulatory Visit (HOSPITAL_COMMUNITY): Payer: Medicare HMO

## 2022-12-25 ENCOUNTER — Encounter: Payer: Self-pay | Admitting: Cardiovascular Disease

## 2022-12-25 DIAGNOSIS — Z0000 Encounter for general adult medical examination without abnormal findings: Secondary | ICD-10-CM | POA: Diagnosis not present

## 2022-12-26 DIAGNOSIS — R809 Proteinuria, unspecified: Secondary | ICD-10-CM | POA: Diagnosis not present

## 2023-01-05 ENCOUNTER — Ambulatory Visit (HOSPITAL_COMMUNITY): Payer: Medicare HMO | Attending: Cardiology

## 2023-01-05 DIAGNOSIS — I252 Old myocardial infarction: Secondary | ICD-10-CM | POA: Diagnosis not present

## 2023-01-05 DIAGNOSIS — E785 Hyperlipidemia, unspecified: Secondary | ICD-10-CM | POA: Insufficient documentation

## 2023-01-05 DIAGNOSIS — I2510 Atherosclerotic heart disease of native coronary artery without angina pectoris: Secondary | ICD-10-CM

## 2023-01-05 LAB — ECHOCARDIOGRAM COMPLETE
Area-P 1/2: 2.46 cm2
S' Lateral: 4.6 cm

## 2023-01-05 MED ORDER — PERFLUTREN LIPID MICROSPHERE
1.0000 mL | INTRAVENOUS | Status: AC | PRN
  Administered 2023-01-05: 2 mL via INTRAVENOUS

## 2023-01-11 DIAGNOSIS — E7800 Pure hypercholesterolemia, unspecified: Secondary | ICD-10-CM | POA: Diagnosis not present

## 2023-01-11 DIAGNOSIS — I255 Ischemic cardiomyopathy: Secondary | ICD-10-CM | POA: Diagnosis not present

## 2023-01-11 DIAGNOSIS — Z7984 Long term (current) use of oral hypoglycemic drugs: Secondary | ICD-10-CM | POA: Diagnosis not present

## 2023-01-11 DIAGNOSIS — N182 Chronic kidney disease, stage 2 (mild): Secondary | ICD-10-CM | POA: Diagnosis not present

## 2023-01-11 DIAGNOSIS — Z125 Encounter for screening for malignant neoplasm of prostate: Secondary | ICD-10-CM | POA: Diagnosis not present

## 2023-01-11 DIAGNOSIS — Z0000 Encounter for general adult medical examination without abnormal findings: Secondary | ICD-10-CM | POA: Diagnosis not present

## 2023-01-11 DIAGNOSIS — I252 Old myocardial infarction: Secondary | ICD-10-CM | POA: Diagnosis not present

## 2023-01-11 DIAGNOSIS — Z1211 Encounter for screening for malignant neoplasm of colon: Secondary | ICD-10-CM | POA: Diagnosis not present

## 2023-01-11 DIAGNOSIS — E119 Type 2 diabetes mellitus without complications: Secondary | ICD-10-CM | POA: Diagnosis not present

## 2023-01-11 DIAGNOSIS — Z79899 Other long term (current) drug therapy: Secondary | ICD-10-CM | POA: Diagnosis not present

## 2023-01-11 DIAGNOSIS — E1122 Type 2 diabetes mellitus with diabetic chronic kidney disease: Secondary | ICD-10-CM | POA: Diagnosis not present

## 2023-01-11 DIAGNOSIS — I5020 Unspecified systolic (congestive) heart failure: Secondary | ICD-10-CM | POA: Diagnosis not present

## 2023-01-17 DIAGNOSIS — Z1211 Encounter for screening for malignant neoplasm of colon: Secondary | ICD-10-CM | POA: Diagnosis not present

## 2023-05-15 DIAGNOSIS — E1122 Type 2 diabetes mellitus with diabetic chronic kidney disease: Secondary | ICD-10-CM | POA: Diagnosis not present

## 2023-05-15 DIAGNOSIS — I255 Ischemic cardiomyopathy: Secondary | ICD-10-CM | POA: Diagnosis not present

## 2023-05-15 DIAGNOSIS — N2581 Secondary hyperparathyroidism of renal origin: Secondary | ICD-10-CM | POA: Diagnosis not present

## 2023-05-15 DIAGNOSIS — I129 Hypertensive chronic kidney disease with stage 1 through stage 4 chronic kidney disease, or unspecified chronic kidney disease: Secondary | ICD-10-CM | POA: Diagnosis not present

## 2023-05-15 DIAGNOSIS — D631 Anemia in chronic kidney disease: Secondary | ICD-10-CM | POA: Diagnosis not present

## 2023-05-15 DIAGNOSIS — N183 Chronic kidney disease, stage 3 unspecified: Secondary | ICD-10-CM | POA: Diagnosis not present

## 2023-05-15 DIAGNOSIS — R809 Proteinuria, unspecified: Secondary | ICD-10-CM | POA: Diagnosis not present

## 2023-05-28 DIAGNOSIS — I10 Essential (primary) hypertension: Secondary | ICD-10-CM | POA: Diagnosis not present

## 2023-06-22 DIAGNOSIS — Z91141 Patient's other noncompliance with medication regimen due to financial hardship: Secondary | ICD-10-CM | POA: Diagnosis not present

## 2023-06-22 DIAGNOSIS — E1122 Type 2 diabetes mellitus with diabetic chronic kidney disease: Secondary | ICD-10-CM | POA: Diagnosis not present

## 2023-06-22 DIAGNOSIS — N182 Chronic kidney disease, stage 2 (mild): Secondary | ICD-10-CM | POA: Diagnosis not present

## 2023-06-22 DIAGNOSIS — R809 Proteinuria, unspecified: Secondary | ICD-10-CM | POA: Diagnosis not present

## 2023-06-22 DIAGNOSIS — I255 Ischemic cardiomyopathy: Secondary | ICD-10-CM | POA: Diagnosis not present

## 2023-06-22 DIAGNOSIS — I5020 Unspecified systolic (congestive) heart failure: Secondary | ICD-10-CM | POA: Diagnosis not present

## 2023-06-22 DIAGNOSIS — E1129 Type 2 diabetes mellitus with other diabetic kidney complication: Secondary | ICD-10-CM | POA: Diagnosis not present

## 2023-06-22 DIAGNOSIS — I252 Old myocardial infarction: Secondary | ICD-10-CM | POA: Diagnosis not present

## 2023-06-22 DIAGNOSIS — E7800 Pure hypercholesterolemia, unspecified: Secondary | ICD-10-CM | POA: Diagnosis not present

## 2023-06-22 DIAGNOSIS — I130 Hypertensive heart and chronic kidney disease with heart failure and stage 1 through stage 4 chronic kidney disease, or unspecified chronic kidney disease: Secondary | ICD-10-CM | POA: Diagnosis not present

## 2023-06-27 ENCOUNTER — Other Ambulatory Visit: Payer: Self-pay | Admitting: Physician Assistant

## 2023-09-17 DIAGNOSIS — N183 Chronic kidney disease, stage 3 unspecified: Secondary | ICD-10-CM | POA: Diagnosis not present

## 2023-09-21 DIAGNOSIS — N183 Chronic kidney disease, stage 3 unspecified: Secondary | ICD-10-CM | POA: Diagnosis not present

## 2023-09-21 DIAGNOSIS — E1122 Type 2 diabetes mellitus with diabetic chronic kidney disease: Secondary | ICD-10-CM | POA: Diagnosis not present

## 2023-09-21 DIAGNOSIS — I129 Hypertensive chronic kidney disease with stage 1 through stage 4 chronic kidney disease, or unspecified chronic kidney disease: Secondary | ICD-10-CM | POA: Diagnosis not present

## 2023-11-21 ENCOUNTER — Other Ambulatory Visit: Payer: Self-pay | Admitting: Cardiovascular Disease

## 2023-11-27 DIAGNOSIS — H43811 Vitreous degeneration, right eye: Secondary | ICD-10-CM | POA: Diagnosis not present

## 2023-11-27 DIAGNOSIS — Z961 Presence of intraocular lens: Secondary | ICD-10-CM | POA: Diagnosis not present

## 2023-11-27 DIAGNOSIS — H524 Presbyopia: Secondary | ICD-10-CM | POA: Diagnosis not present

## 2023-11-27 DIAGNOSIS — E119 Type 2 diabetes mellitus without complications: Secondary | ICD-10-CM | POA: Diagnosis not present

## 2023-11-30 ENCOUNTER — Encounter: Payer: Self-pay | Admitting: Cardiovascular Disease

## 2023-11-30 ENCOUNTER — Ambulatory Visit: Payer: Medicare HMO | Attending: Cardiovascular Disease | Admitting: Cardiovascular Disease

## 2023-11-30 VITALS — BP 120/60 | HR 61 | Ht 71.0 in | Wt 215.8 lb

## 2023-11-30 DIAGNOSIS — E785 Hyperlipidemia, unspecified: Secondary | ICD-10-CM

## 2023-11-30 DIAGNOSIS — I252 Old myocardial infarction: Secondary | ICD-10-CM

## 2023-11-30 DIAGNOSIS — R001 Bradycardia, unspecified: Secondary | ICD-10-CM | POA: Diagnosis not present

## 2023-11-30 DIAGNOSIS — I255 Ischemic cardiomyopathy: Secondary | ICD-10-CM | POA: Diagnosis not present

## 2023-11-30 NOTE — Patient Instructions (Signed)
   Follow-Up: At [facility], you and your health needs are our priority.  As part of our continuing mission to provide you with exceptional heart care, we have created designated Provider Care Teams.  These Care Teams include your primary Cardiologist (physician) and Advanced Practice Providers (APPs -  Physician Assistants and Nurse Practitioners) who all work together to provide you with the care you need, when you need it.     Your next appointment:   6 month(s)  Provider:   Azalee Course, PA-C    Then, [name], MD will plan to see you again in 12 month(s).

## 2023-11-30 NOTE — Progress Notes (Signed)
11/30/2023 Cory Larson   [birth year]  [id number]  Primary Physician Cory Found, MD Primary Cardiologist: Cory Gess MD Cory Larson, [place]  HPI:  Cory Larson is a 73 y.o.     fit appearing single Caucasian male father of 2 children who works as a Engineer, structural but developed sudden onset chest pain at 8:30 in the morning.  I last saw him in the office 11/24/2022.Cory Larson  He has seen several times in the interim by Cory Course PA-C .  Cardiac risk factors include treated diabetes, hyperlipidemia and untreated hypertension.  He does not smoke.  Never had a heart attack or stroke.  He did dial 911 and was brought by EMS to [facility] emergency room where his EKG showed inferolateral ST segment elevation.  His pain somewhat improved with sublingual nitroglycerin.  He was hypertensive in the ER with a blood pressure of 170/110 although his pain had markedly improved.  He did have persistent ST segment elevation.  He was brought urgently to the Cath Lab for angiography and intervention.   He was discharged home 2 days later.  He is started on Entresto and low-dose carvedilol.  He remains on dual antiplatelet therapy.  He denies chest pain but has had a little fatigue on maximal exertion.  Recent 2D echo revealed an EF of 30 to 35% on 11/04/2020 consistent with continued LV dysfunction.   I did refer him to Dr. Royann Larson for consideration of ICD implantation for primary prevention however the patient was hesitant to pursue this.  He has been very active and is completely asymptomatic.   Since I saw him a year ago in the office he continues to do well.  He is now walking up to 3 miles a day.  He denies chest pain or shortness of breath.  His most recent 2D echo showed an increase in his EF from 30 to 35% up to 40 to 45% on 01/05/2023..     Current Meds  Medication Sig   aspirin ([employer] ASPIRIN ADULT LOW DOSE) 81 MG chewable tablet CHEW AND SWALLOW 1 TABLET BY MOUTH EVERY DAY    clopidogrel (PLAVIX) 75 MG tablet TAKE 1 TABLET BY MOUTH EVERY DAY   Cyanocobalamin 1000 MCG/ML LIQD Take by mouth daily.   glipiZIDE (GLUCOTROL XL) 5 MG 24 hr tablet Take 5 mg by mouth 2 (two) times daily.   metFORMIN (GLUCOPHAGE) 500 MG tablet Take 500 mg by mouth daily.   nitroGLYCERIN (NITROSTAT) 0.4 MG SL tablet PLACE 1 TABLET UNDER THE TONGUE EVERY 5 MINUTES X 3 DOSES AS NEEDED FOR CHEST PAIN.   rosuvastatin (CRESTOR) 40 MG tablet Take 1 tablet (40 mg total) by mouth daily.   valsartan (DIOVAN) 80 MG tablet Take 80 mg by mouth daily.     No Known Allergies  Social History   Socioeconomic History   Marital status: Single    Spouse name: Not on file   Number of children: Not on file   Years of education: Not on file   Highest education level: Not on file  Occupational History   Not on file  Tobacco Use   Smoking status: Never   Smokeless tobacco: Never  Substance and Sexual Activity   Alcohol use: Not on file   Drug use: Not on file   Sexual activity: Not on file  Other Topics Concern   Not on file  Social History Narrative   Not on file   Social Drivers  of Health   Financial Resource Strain: Not on file  Food Insecurity: Low Risk  (06/22/2023)   Received from [facility]   Hunger Vital Sign    Worried About Running Out of Food in the Last Year: Never true    Ran Out of Food in the Last Year: Never true  Transportation Needs: No Transportation Needs (06/22/2023)   Received from [facility]    In the past 12 months, has lack of reliable transportation kept you from medical appointments, meetings, work or from getting things needed for daily living? : No  Physical Activity: Not on file  Stress: Not on file  Social Connections: Not on file  Intimate Partner Violence: Not on file     Review of Systems: General: negative for chills, fever, night sweats or weight changes.  Cardiovascular: negative for chest pain, dyspnea on exertion, edema,  orthopnea, palpitations, paroxysmal nocturnal dyspnea or shortness of breath Dermatological: negative for rash Respiratory: negative for cough or wheezing Urologic: negative for hematuria Abdominal: negative for nausea, vomiting, diarrhea, bright red blood per rectum, melena, or hematemesis Neurologic: negative for visual changes, syncope, or dizziness All other systems reviewed and are otherwise negative except as noted above.    Blood pressure 120/60, pulse 61, height 5\' 11"  (1.803 m), weight 215 lb 12.8 oz (97.9 kg).  General appearance: alert and no distress Neck: no adenopathy, no carotid bruit, no JVD, supple, symmetrical, trachea midline, and thyroid not enlarged, symmetric, no tenderness/mass/nodules Lungs: clear to auscultation bilaterally Heart: regular rate and rhythm, S1, S2 normal, no murmur, click, rub or gallop Extremities: extremities normal, atraumatic, no cyanosis or edema Pulses: 2+ and symmetric Skin: Skin color, texture, turgor normal. No rashes or lesions Neurologic: Grossly normal  EKG EKG Interpretation Date/Time:  Friday November 30 2023 10:01:01 EST Ventricular Rate:  61 PR Interval:  178 QRS Duration:  122 QT Interval:  414 QTC Calculation: 416 R Axis:   21  Text Interpretation: Sinus rhythm with occasional Premature ventricular complexes Non-specific intra-ventricular conduction delay T wave abnormality, consider inferior ischemia When compared with ECG of 11-May-2022 14:45, Premature ventricular complexes are now Present Confirmed by [name] [phone]) on 11/30/2023 10:20:11 AM    ASSESSMENT AND PLAN:   History of ST elevation myocardial infarction (STEMI) History of inferior STEMI 07/10/2020.  He woke with chest pain a 3 in the morning.  I brought him to the Cath Lab and aspirated significantly out of thrombus from his RCA and implanted a stent.  His left system was free of disease.  He has had no recurrent symptoms.  He remains on dual antiplatelet  therapy.  Symptomatic bradycardia History of asymptomatic bradycardia with heart rates in the 60s.  This is probably related to long history of running marathons and being physically fit.  However this did preclude him from beginning beta-blockers for GDMT for his ischemic cardiomyopathy.  Dyslipidemia, goal LDL below 70 History of dyslipidemia on high-dose statin therapy with lipid profile performed 11/24/2022 revealing total cholesterol 114, LDL 45 and HDL 50.  Ischemic cardiomyopathy History of ischemic cardiomyopathy with an EF in the 30 to 35% from the time of his STEMI until most recently.  Echo performed 01/05/2023 revealed an increase in EF to 40 to 45%.  He was on Entresto however this became a financial constraint and he was changed to Diovan.     Cory Gess MD FACP,FACC,FAHA, [facility] 11/30/2023 10:32 AM

## 2023-11-30 NOTE — Assessment & Plan Note (Signed)
History of ischemic cardiomyopathy with an EF in the 30 to 35% from the time of his STEMI until most recently.  Echo performed 01/05/2023 revealed an increase in EF to 40 to 45%.  He was on Entresto however this became a financial constraint and he was changed to Diovan.

## 2023-11-30 NOTE — Assessment & Plan Note (Signed)
History of inferior STEMI 07/10/2020.  He woke with chest pain a 3 in the morning.  I brought him to the Cath Lab and aspirated significantly out of thrombus from his RCA and implanted a stent.  His left system was free of disease.  He has had no recurrent symptoms.  He remains on dual antiplatelet therapy.

## 2023-11-30 NOTE — Assessment & Plan Note (Signed)
History of dyslipidemia on high-dose statin therapy with lipid profile performed 11/24/2022 revealing total cholesterol 114, LDL 45 and HDL 50.

## 2023-11-30 NOTE — Assessment & Plan Note (Signed)
History of asymptomatic bradycardia with heart rates in the 60s.  This is probably related to long history of running marathons and being physically fit.  However this did preclude him from beginning beta-blockers for GDMT for his ischemic cardiomyopathy.

## 2023-12-19 ENCOUNTER — Other Ambulatory Visit: Payer: Self-pay | Admitting: Cardiovascular Disease

## 2024-02-19 DIAGNOSIS — Z125 Encounter for screening for malignant neoplasm of prostate: Secondary | ICD-10-CM | POA: Diagnosis not present

## 2024-02-19 DIAGNOSIS — I2510 Atherosclerotic heart disease of native coronary artery without angina pectoris: Secondary | ICD-10-CM | POA: Diagnosis not present

## 2024-02-19 DIAGNOSIS — E1129 Type 2 diabetes mellitus with other diabetic kidney complication: Secondary | ICD-10-CM | POA: Diagnosis not present

## 2024-02-19 DIAGNOSIS — E785 Hyperlipidemia, unspecified: Secondary | ICD-10-CM | POA: Diagnosis not present

## 2024-02-19 DIAGNOSIS — I129 Hypertensive chronic kidney disease with stage 1 through stage 4 chronic kidney disease, or unspecified chronic kidney disease: Secondary | ICD-10-CM | POA: Diagnosis not present

## 2024-02-19 DIAGNOSIS — R809 Proteinuria, unspecified: Secondary | ICD-10-CM | POA: Diagnosis not present

## 2024-02-19 DIAGNOSIS — N1831 Chronic kidney disease, stage 3a: Secondary | ICD-10-CM | POA: Diagnosis not present

## 2024-02-19 DIAGNOSIS — I255 Ischemic cardiomyopathy: Secondary | ICD-10-CM | POA: Diagnosis not present

## 2024-02-22 ENCOUNTER — Other Ambulatory Visit: Payer: Self-pay | Admitting: Cardiovascular Disease

## 2024-02-26 DIAGNOSIS — R809 Proteinuria, unspecified: Secondary | ICD-10-CM | POA: Diagnosis not present

## 2024-02-26 DIAGNOSIS — E785 Hyperlipidemia, unspecified: Secondary | ICD-10-CM | POA: Diagnosis not present

## 2024-02-26 DIAGNOSIS — I255 Ischemic cardiomyopathy: Secondary | ICD-10-CM | POA: Diagnosis not present

## 2024-02-26 DIAGNOSIS — N1831 Chronic kidney disease, stage 3a: Secondary | ICD-10-CM | POA: Diagnosis not present

## 2024-02-26 DIAGNOSIS — E1129 Type 2 diabetes mellitus with other diabetic kidney complication: Secondary | ICD-10-CM | POA: Diagnosis not present

## 2024-02-26 DIAGNOSIS — I2510 Atherosclerotic heart disease of native coronary artery without angina pectoris: Secondary | ICD-10-CM | POA: Diagnosis not present

## 2024-02-26 DIAGNOSIS — I129 Hypertensive chronic kidney disease with stage 1 through stage 4 chronic kidney disease, or unspecified chronic kidney disease: Secondary | ICD-10-CM | POA: Diagnosis not present

## 2024-02-26 DIAGNOSIS — Z125 Encounter for screening for malignant neoplasm of prostate: Secondary | ICD-10-CM | POA: Diagnosis not present

## 2024-05-13 DIAGNOSIS — E1129 Type 2 diabetes mellitus with other diabetic kidney complication: Secondary | ICD-10-CM | POA: Diagnosis not present

## 2024-05-13 DIAGNOSIS — H60502 Unspecified acute noninfective otitis externa, left ear: Secondary | ICD-10-CM | POA: Diagnosis not present

## 2024-05-13 DIAGNOSIS — H6692 Otitis media, unspecified, left ear: Secondary | ICD-10-CM | POA: Diagnosis not present

## 2024-05-13 DIAGNOSIS — Z7984 Long term (current) use of oral hypoglycemic drugs: Secondary | ICD-10-CM | POA: Diagnosis not present

## 2024-06-18 DIAGNOSIS — H9212 Otorrhea, left ear: Secondary | ICD-10-CM | POA: Diagnosis not present

## 2024-07-08 DIAGNOSIS — I255 Ischemic cardiomyopathy: Secondary | ICD-10-CM | POA: Diagnosis not present

## 2024-07-08 DIAGNOSIS — R42 Dizziness and giddiness: Secondary | ICD-10-CM | POA: Diagnosis not present

## 2024-07-08 DIAGNOSIS — D649 Anemia, unspecified: Secondary | ICD-10-CM | POA: Diagnosis not present

## 2024-07-08 DIAGNOSIS — E1129 Type 2 diabetes mellitus with other diabetic kidney complication: Secondary | ICD-10-CM | POA: Diagnosis not present

## 2024-07-08 DIAGNOSIS — R809 Proteinuria, unspecified: Secondary | ICD-10-CM | POA: Diagnosis not present

## 2024-07-10 DIAGNOSIS — Z23 Encounter for immunization: Secondary | ICD-10-CM | POA: Diagnosis not present

## 2024-07-14 ENCOUNTER — Ambulatory Visit: Attending: Cardiovascular Disease | Admitting: Cardiovascular Disease

## 2024-07-14 ENCOUNTER — Encounter: Payer: Self-pay | Admitting: Cardiovascular Disease

## 2024-07-14 VITALS — BP 110/68 | HR 57 | Ht 71.0 in | Wt 202.1 lb

## 2024-07-14 DIAGNOSIS — I252 Old myocardial infarction: Secondary | ICD-10-CM

## 2024-07-14 DIAGNOSIS — I255 Ischemic cardiomyopathy: Secondary | ICD-10-CM | POA: Diagnosis not present

## 2024-07-14 DIAGNOSIS — E785 Hyperlipidemia, unspecified: Secondary | ICD-10-CM | POA: Diagnosis not present

## 2024-07-14 NOTE — Assessment & Plan Note (Signed)
 History of inferolateral STEMI 07/10/2020 with cath which I performed revealing an occluded proximal RCA which I stented.  He had no significant disease in his left system.  He did have inferior akinesia.  He remains on DAPT.  He is asymptomatic.

## 2024-07-14 NOTE — Assessment & Plan Note (Signed)
 History of ischemic cardiomyopathy with an EF at the time of his STEMI of 30 to 35%.  His most recent 2D echo performed 01/05/2023 revealed an increase in his EF to 40 to 45%.  He is on valsartan but is not on a beta-blocker because of bradycardia.

## 2024-07-14 NOTE — Patient Instructions (Addendum)
 Medication Instructions:  Your physician recommends that you continue on your current medications as directed. Please refer to the Current Medication list given to you today.  *If you need a refill on your cardiac medications before your next appointment, please call your pharmacy*  Follow-Up: At [facility], you and your health needs are our priority.  As part of our continuing mission to provide you with exceptional heart care, our providers are all part of one team.  This team includes your primary Cardiologist (physician) and Advanced Practice Providers or APPs (Physician Assistants and Nurse Practitioners) who all work together to provide you with the care you need, when you need it.  Your next appointment:   3 month(s)  Provider:   Hao Meng, PA-C then, Dorn Lesches, MD will plan to see you again in 6 month(s).

## 2024-07-14 NOTE — Assessment & Plan Note (Signed)
 History of dyslipidemia on high-dose statin therapy with lipid profile performed 11/24/2022 revealing total cholesterol 114, LDL 45 and HDL 50.

## 2024-07-14 NOTE — Progress Notes (Signed)
 07/14/2024 Charlie ONEIDA Stark   05/29/1951  [id number]  Primary Physician Macarthur Elouise SQUIBB, DO Primary Cardiologist: Dorn JINNY Lesches MD GENI CODY [place], [place]  HPI:  Cory Larson is a 73 y.o.   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   Cardiac risk factors include treated diabetes, hyperlipidemia and untreated hypertension.  He does not smoke.  Never had a heart attack or stroke.  He did dial 911 and was brought by EMS to [facility] emergency room where his EKG showed inferolateral ST segment elevation.  His pain somewhat improved with sublingual nitroglycerin .  He was hypertensive in the ER with a blood pressure of 170/110 although his pain had markedly improved.  He did have persistent ST segment elevation.  He was brought urgently to the Cath Lab for angiography and intervention.   He was discharged home 2 days later.  He is started on Entresto  and low-dose carvedilol .  He remains on dual antiplatelet therapy.  He denies chest pain but has had a little fatigue on maximal exertion.  Recent 2D echo revealed an EF of 30 to 35% on 11/04/2020 consistent with continued LV dysfunction.   I did refer him to Dr. Francyne for consideration of ICD implantation for primary prevention however the patient was hesitant to pursue this.  He has been very active and is completely asymptomatic.   Since I saw him 9 months ago he continues to do well.  He is still active, does yoga in the morning, plays pickle ball 3 times a week and walks a mile a day.  He denies chest pain or shortness of breath.  He has had some episodes of dizziness for unclear reasons.  He is lost 13 pounds since I last saw him because of being on a GLP-1 agonist.   Current Meds  Medication Sig   aspirin  ([employer] ASPIRIN  ADULT LOW DOSE) 81 MG chewable tablet CHEW AND SWALLOW 1 TABLET BY MOUTH EVERY DAY   clopidogrel  (PLAVIX ) 75 MG tablet Take 1  tablet (75 mg total) by mouth daily.   Cyanocobalamin 1000 MCG/ML LIQD Take by mouth daily.   glipiZIDE (GLUCOTROL XL) 5 MG 24 hr tablet Take 5 mg by mouth 2 (two) times daily.   MOUNJARO 2.5 MG/0.5ML Pen Inject 2.5 mg into the skin once a week.   nitroGLYCERIN  (NITROSTAT ) 0.4 MG SL tablet PLACE 1 TABLET UNDER THE TONGUE EVERY 5 MINUTES X 3 DOSES AS NEEDED FOR CHEST PAIN.   rosuvastatin  (CRESTOR ) 40 MG tablet Take 1 tablet (40 mg total) by mouth daily.   valsartan (DIOVAN) 80 MG tablet Take 80 mg by mouth daily.     No Known Allergies  Social History   Socioeconomic History   Marital status: Single    Spouse name: Not on file   Number of children: Not on file   Years of education: Not on file   Highest education level: Not on file  Occupational History   Not on file  Tobacco Use   Smoking status: Never   Smokeless tobacco: Never  Substance and Sexual Activity   Alcohol use: Not on file   Drug use: Not on file   Sexual activity: Not on file  Other Topics Concern   Not on file  Social History Narrative   Not on file   Social Drivers of Health   Financial Resource Strain: Not on file  Food Insecurity: Unknown (02/19/2024)   Received from [facility]   Hunger Vital Sign    Within the past 12 months, you worried that your food would run out before you got money to buy more: Patient declined to answer    Within the past 12 months, the food you bought just didn't last and you didn't have money to get more. : Patient declined to answer  Transportation Needs: No Transportation Needs (02/19/2024)   Received from [facility]    In the past 12 months, has lack of reliable transportation kept you from medical appointments, meetings, work or from getting things needed for daily living? : No  Physical Activity: Not on file  Stress: Not on file  Social Connections: Not on file  Intimate Partner Violence: Not on file     Review of Systems: General: negative for  chills, fever, night sweats or weight changes.  Cardiovascular: negative for chest pain, dyspnea on exertion, edema, orthopnea, palpitations, paroxysmal nocturnal dyspnea or shortness of breath Dermatological: negative for rash Respiratory: negative for cough or wheezing Urologic: negative for hematuria Abdominal: negative for nausea, vomiting, diarrhea, bright red blood per rectum, melena, or hematemesis Neurologic: negative for visual changes, syncope, or dizziness All other systems reviewed and are otherwise negative except as noted above.    Blood pressure 110/68, pulse (!) 57, height 5' 11 (1.803 m), weight 202 lb 1.6 oz (91.7 kg), SpO2 99%.  General appearance: alert and no distress Neck: no adenopathy, no carotid bruit, no JVD, supple, symmetrical, trachea midline, and thyroid not enlarged, symmetric, no tenderness/mass/nodules Lungs: clear to auscultation bilaterally Heart: regular rate and rhythm, S1, S2 normal, no murmur, click, rub or gallop Extremities: extremities normal, atraumatic, no cyanosis or edema Pulses: 2+ and symmetric Skin: Skin color, texture, turgor normal. No rashes or lesions Neurologic: Grossly normal  EKG not performed today      ASSESSMENT AND PLAN:   History of ST elevation myocardial infarction (STEMI) History of inferolateral STEMI 07/10/2020 with cath which I performed revealing an occluded proximal RCA which I stented.  He had no significant disease in his left system.  He did have inferior akinesia.  He remains on DAPT.  He is asymptomatic.  Dyslipidemia, goal LDL below 70 History of dyslipidemia on high-dose statin therapy with lipid profile performed 11/24/2022 revealing total cholesterol 114, LDL 45 and HDL 50.  Ischemic cardiomyopathy History of ischemic cardiomyopathy with an EF at the time of his STEMI of 30 to 35%.  His most recent 2D echo performed 01/05/2023 revealed an increase in his EF to 40 to 45%.  He is on valsartan but is not on a  beta-blocker because of bradycardia.     Dorn DOROTHA Lesches MD FACP,FACC,FAHA, [facility] 07/14/2024 11:42 AM

## 2024-07-15 ENCOUNTER — Other Ambulatory Visit: Payer: Self-pay | Admitting: Cardiovascular Disease

## 2024-07-15 DIAGNOSIS — N179 Acute kidney failure, unspecified: Secondary | ICD-10-CM | POA: Diagnosis not present

## 2024-07-23 DIAGNOSIS — R001 Bradycardia, unspecified: Secondary | ICD-10-CM | POA: Diagnosis not present

## 2024-07-23 DIAGNOSIS — R42 Dizziness and giddiness: Secondary | ICD-10-CM | POA: Diagnosis not present

## 2024-07-30 DIAGNOSIS — N179 Acute kidney failure, unspecified: Secondary | ICD-10-CM | POA: Diagnosis not present

## 2024-08-14 DIAGNOSIS — N179 Acute kidney failure, unspecified: Secondary | ICD-10-CM | POA: Diagnosis not present

## 2024-09-02 DIAGNOSIS — N179 Acute kidney failure, unspecified: Secondary | ICD-10-CM | POA: Diagnosis not present

## 2024-09-16 DIAGNOSIS — N183 Chronic kidney disease, stage 3 unspecified: Secondary | ICD-10-CM | POA: Diagnosis not present

## 2024-09-22 DIAGNOSIS — N179 Acute kidney failure, unspecified: Secondary | ICD-10-CM | POA: Diagnosis not present

## 2024-09-22 DIAGNOSIS — I129 Hypertensive chronic kidney disease with stage 1 through stage 4 chronic kidney disease, or unspecified chronic kidney disease: Secondary | ICD-10-CM | POA: Diagnosis not present

## 2024-09-22 DIAGNOSIS — R399 Unspecified symptoms and signs involving the genitourinary system: Secondary | ICD-10-CM | POA: Diagnosis not present

## 2024-09-22 DIAGNOSIS — N183 Chronic kidney disease, stage 3 unspecified: Secondary | ICD-10-CM | POA: Diagnosis not present

## 2024-09-23 ENCOUNTER — Other Ambulatory Visit: Payer: Self-pay | Admitting: Nephrology

## 2024-09-23 DIAGNOSIS — N179 Acute kidney failure, unspecified: Secondary | ICD-10-CM

## 2024-09-23 DIAGNOSIS — N183 Chronic kidney disease, stage 3 unspecified: Secondary | ICD-10-CM

## 2024-10-01 ENCOUNTER — Ambulatory Visit
Admission: RE | Admit: 2024-10-01 | Discharge: 2024-10-01 | Disposition: A | Source: Ambulatory Visit | Attending: Nephrology | Admitting: Nephrology

## 2024-10-01 DIAGNOSIS — N183 Chronic kidney disease, stage 3 unspecified: Secondary | ICD-10-CM | POA: Diagnosis not present

## 2024-10-01 DIAGNOSIS — N179 Acute kidney failure, unspecified: Secondary | ICD-10-CM

## 2024-10-03 DIAGNOSIS — N179 Acute kidney failure, unspecified: Secondary | ICD-10-CM | POA: Diagnosis not present

## 2024-10-08 DIAGNOSIS — E785 Hyperlipidemia, unspecified: Secondary | ICD-10-CM | POA: Diagnosis not present

## 2024-10-08 DIAGNOSIS — N1832 Chronic kidney disease, stage 3b: Secondary | ICD-10-CM | POA: Diagnosis not present

## 2024-10-08 DIAGNOSIS — I255 Ischemic cardiomyopathy: Secondary | ICD-10-CM | POA: Diagnosis not present

## 2024-10-08 DIAGNOSIS — R809 Proteinuria, unspecified: Secondary | ICD-10-CM | POA: Diagnosis not present

## 2024-10-08 DIAGNOSIS — I2510 Atherosclerotic heart disease of native coronary artery without angina pectoris: Secondary | ICD-10-CM | POA: Diagnosis not present

## 2024-10-08 DIAGNOSIS — D649 Anemia, unspecified: Secondary | ICD-10-CM | POA: Diagnosis not present

## 2024-10-08 DIAGNOSIS — E1129 Type 2 diabetes mellitus with other diabetic kidney complication: Secondary | ICD-10-CM | POA: Diagnosis not present

## 2024-10-08 DIAGNOSIS — N400 Enlarged prostate without lower urinary tract symptoms: Secondary | ICD-10-CM | POA: Diagnosis not present

## 2024-10-09 ENCOUNTER — Ambulatory Visit: Attending: Physician Assistant | Admitting: Physician Assistant

## 2024-10-09 ENCOUNTER — Encounter: Payer: Self-pay | Admitting: Physician Assistant

## 2024-10-09 ENCOUNTER — Ambulatory Visit: Admitting: Physician Assistant

## 2024-10-09 VITALS — BP 118/62 | HR 73 | Ht 71.0 in | Wt 189.0 lb

## 2024-10-09 DIAGNOSIS — E785 Hyperlipidemia, unspecified: Secondary | ICD-10-CM

## 2024-10-09 DIAGNOSIS — I10 Essential (primary) hypertension: Secondary | ICD-10-CM | POA: Diagnosis not present

## 2024-10-09 DIAGNOSIS — I255 Ischemic cardiomyopathy: Secondary | ICD-10-CM | POA: Diagnosis not present

## 2024-10-09 DIAGNOSIS — I2510 Atherosclerotic heart disease of native coronary artery without angina pectoris: Secondary | ICD-10-CM

## 2024-10-09 DIAGNOSIS — E119 Type 2 diabetes mellitus without complications: Secondary | ICD-10-CM | POA: Diagnosis not present

## 2024-10-09 NOTE — Patient Instructions (Signed)
 Medication Instructions:  NO CHANGES *If you need a refill on your cardiac medications before your next appointment, please call your pharmacy*  Lab Work: NO LABS If you have labs (blood work) drawn today and your tests are completely normal, you will receive your results only by: MyChart Message (if you have MyChart) OR A paper copy in the mail If you have any lab test that is abnormal or we need to change your treatment, we will call you to review the results.  Testing/Procedures: NO TESTING  Follow-Up: At [facility], you and your health needs are our priority.  As part of our continuing mission to provide you with exceptional heart care, our providers are all part of one team.  This team includes your primary Cardiologist (physician) and Advanced Practice Providers or APPs (Physician Assistants and Nurse Practitioners) who all work together to provide you with the care you need, when you need it.  Your next appointment:   3-4 month(s)  Provider:   Dorn Lesches, MD

## 2024-10-09 NOTE — Progress Notes (Unsigned)
°  Cardiology Office Note   Date:  10/09/2024  ID:  Cory Larson, DOB 08/05/1951, MRN [id number] PCP: Macarthur Elouise SQUIBB, DO  [facility] Providers Cardiologist:  Dorn Lesches, MD { Click to update primary MD,subspecialty MD or APP then REFRESH:1}    History of Present Illness Cory Larson is a 73 y.o. male with a hx of HTN, HLD, DM2, ICM and CAD.  He was previously admitted in September 2021 with inferolateral STEMI secondary to occlusion of the large dominant RCA, EF was 35 to 45% at the time with severe inferior and inferior apical wall motion abnormality.  This was treated with with Synergy XD 3.0 x 32 mm DES.  There was no other significant coronary artery disease.  Initial echocardiogram showed EF 30 to 35% with akinesis in the RCA territory, grade 1 DD.  He was started on Entresto , carvedilol  and antiplatelet therapy. Subsequent echocardiogram in January 2022 which continue to show EF 30 to 35%.  He was referred to Dr. Francyne for evaluation of ICD implantation, however patient was hesitant to pursue this.   Echocardiogram in May 2022 showed EF is unchanged at 30 to 35%, grade 1 DD, no significant valve issue.  He was seen in the ED on 05/11/2022 with AKI, creatinine trended up to 2.15 when checked by PCP.  Spironolactone , Entresto  and metformin were discontinued.  Repeat blood work in the emergency room shows creatinine has came down to 1.95.  Renal artery Doppler showed no sonographic abnormality seen in the kidneys, no hydronephrosis.  Last echocardiogram obtained on 01/05/2023 showed EF 40 to 45%, normal RV, no significant valve issue.  I reintroduced Entresto  back in September 2023, Entresto  was later reduced to valsartan.  He has significant weight loss after placing on GLP-1.  Patient is not on beta-blocker due to bradycardia.  Patient presents today for follow-up.  He is doing well and stays fairly active.  He denies any major chest pain or shortness of breath.  He has  significant weight loss on Mounjaro and is tolerating the medication okay.  He has no lower extremity edema, orthopnea or PND.  He can follow-up with Dr. Lesches in 3 to 4 months.  ROS: ***  Studies Reviewed      *** Risk Assessment/Calculations {Does this patient have ATRIAL FIBRILLATION?:[phone]}         Physical Exam VS:  BP 118/62   Pulse 73   Ht 5' 11 (1.803 m)   Wt 189 lb (85.7 kg)   SpO2 100%   BMI 26.36 kg/m        Wt Readings from Last 3 Encounters:  10/09/24 189 lb (85.7 kg)  07/14/24 202 lb 1.6 oz (91.7 kg)  11/30/23 215 lb 12.8 oz (97.9 kg)    GEN: Well nourished, well developed in no acute distress NECK: No JVD; No carotid bruits CARDIAC: ***RRR, no murmurs, rubs, gallops RESPIRATORY:  Clear to auscultation without rales, wheezing or rhonchi  ABDOMEN: Soft, non-tender, non-distended EXTREMITIES:  No edema; No deformity   ASSESSMENT AND PLAN ***    {Are you ordering a CV Procedure (e.g. stress test, cath, DCCV, TEE, etc)?   Press F2        :[id number]}  Dispo: ***  Signed, Scot Ford, PA

## 2024-11-21 ENCOUNTER — Other Ambulatory Visit: Payer: Self-pay | Admitting: Cardiovascular Disease

## 2025-01-07 ENCOUNTER — Ambulatory Visit: Admitting: Cardiovascular Disease
